# Patient Record
Sex: Female | Born: 1955
Health system: Southern US, Community
[De-identification: ages and names within clinical notes are randomized; demographics above are authoritative.]

## PROBLEM LIST (undated history)

## (undated) ENCOUNTER — Ambulatory Visit

## (undated) DIAGNOSIS — R011 Cardiac murmur, unspecified: Secondary | ICD-10-CM

## (undated) DIAGNOSIS — E119 Type 2 diabetes mellitus without complications: Secondary | ICD-10-CM

## (undated) DIAGNOSIS — K219 Gastro-esophageal reflux disease without esophagitis: Secondary | ICD-10-CM

## (undated) DIAGNOSIS — E785 Hyperlipidemia, unspecified: Secondary | ICD-10-CM

## (undated) DIAGNOSIS — F419 Anxiety disorder, unspecified: Secondary | ICD-10-CM

## (undated) DIAGNOSIS — I6529 Occlusion and stenosis of unspecified carotid artery: Secondary | ICD-10-CM

## (undated) DIAGNOSIS — I10 Essential (primary) hypertension: Secondary | ICD-10-CM

## (undated) HISTORY — DX: Occlusion and stenosis of unspecified carotid artery: I65.29

## (undated) HISTORY — DX: Type 2 diabetes mellitus without complications: E11.9

## (undated) HISTORY — DX: Essential (primary) hypertension: I10

## (undated) HISTORY — DX: Hyperlipidemia, unspecified: E78.5

## (undated) HISTORY — DX: Cardiac murmur, unspecified: R01.1

---

## 2011-08-24 ENCOUNTER — Other Ambulatory Visit (HOSPITAL_COMMUNITY): Payer: Self-pay | Admitting: Family Medicine

## 2011-08-24 DIAGNOSIS — I10 Essential (primary) hypertension: Secondary | ICD-10-CM

## 2011-08-24 DIAGNOSIS — R0989 Other specified symptoms and signs involving the circulatory and respiratory systems: Secondary | ICD-10-CM

## 2011-10-02 ENCOUNTER — Ambulatory Visit (HOSPITAL_COMMUNITY)
Admission: RE | Admit: 2011-10-02 | Discharge: 2011-10-02 | Disposition: A | Discharge status: Home / Self Care | Payer: BC Managed Care – PPO | Source: Ambulatory Visit | Attending: Family Medicine | Admitting: Family Medicine

## 2011-10-02 DIAGNOSIS — I6529 Occlusion and stenosis of unspecified carotid artery: Secondary | ICD-10-CM | POA: Insufficient documentation

## 2011-10-02 DIAGNOSIS — F17200 Nicotine dependence, unspecified, uncomplicated: Secondary | ICD-10-CM | POA: Insufficient documentation

## 2011-10-02 DIAGNOSIS — I10 Essential (primary) hypertension: Secondary | ICD-10-CM | POA: Insufficient documentation

## 2011-10-02 DIAGNOSIS — R0989 Other specified symptoms and signs involving the circulatory and respiratory systems: Secondary | ICD-10-CM | POA: Insufficient documentation

## 2011-11-01 HISTORY — PX: NM MYOCAR PERF EJECTION FRACTION: HXRAD630

## 2011-11-29 ENCOUNTER — Encounter: Payer: Self-pay | Admitting: Surgery

## 2011-11-30 ENCOUNTER — Encounter: Payer: Self-pay | Admitting: Surgery

## 2011-12-15 ENCOUNTER — Encounter: Payer: Self-pay | Admitting: Surgery

## 2011-12-18 ENCOUNTER — Encounter: Payer: Self-pay | Admitting: Surgery

## 2011-12-18 ENCOUNTER — Ambulatory Visit (INDEPENDENT_AMBULATORY_CARE_PROVIDER_SITE_OTHER): Payer: BC Managed Care – PPO | Admitting: Surgery

## 2011-12-18 ENCOUNTER — Other Ambulatory Visit: Payer: BC Managed Care – PPO

## 2011-12-18 VITALS — BP 128/64 | HR 87 | Resp 16 | Ht 64.0 in | Wt 171.0 lb

## 2011-12-18 DIAGNOSIS — I6529 Occlusion and stenosis of unspecified carotid artery: Secondary | ICD-10-CM | POA: Insufficient documentation

## 2011-12-18 DIAGNOSIS — Z01810 Encounter for preprocedural cardiovascular examination: Secondary | ICD-10-CM

## 2011-12-18 NOTE — Progress Notes (Signed)
[facility] [place]   Patient name: Rebecca Mathis MRN: [id number] DOB: 11-02-1955 Sex: female   Referred by: Dr. Allyson Sabal  Reason for referral:  Chief Complaint  Patient presents with  . Carotid    New pt, eval for rt CEA/Dr. Nanetta Batty    HISTORY OF PRESENT ILLNESS: The patient is referred today for evaluation of her right carotid stenosis. This was initially detected 3 months ago by auscultation of a carotid bruit. She underwent a duplex ultrasound which revealed a high-grade right carotid stenosis. The patient does have a significant family history for carotid disease. Her mother brother and sister all have carotid stenosis. The patient is a former smoker. She quit 3 months ago. She has a history of hypercholesterolemia which is treated with Crestor. She had a reaction to Lipitor. Her blood pressure is medically managed and is under good control.  From her carotid perspective, she is asymptomatic. Specifically, she denies numbness or weakness in either extremity. She denies slurred speech. She denies amaurosis fugax.  Past Medical History  Diagnosis Date  . Carotid artery occlusion   . Heart murmur   . Hypertension   . Dyslipidemia   . Hyperlipidemia   . CAD (coronary artery disease)     Past Surgical History  Procedure Date  . Cesarean section 1984    History   Social History  . Marital Status: Legally Separated    Spouse Name: N/A    Number of Children: N/A  . Years of Education: N/A   Occupational History  . Not on file.   Social History Main Topics  . Smoking status: Former Smoker    Quit date: 09/18/2011  . Smokeless tobacco: Never Used  . Alcohol Use: No  . Drug Use: No  . Sexually Active: Not on file   Other Topics Concern  . Not on file   Social History Narrative  . No narrative on file    Family History  Problem Relation Age of Onset  . Coronary artery disease Mother   . Cancer Mother     ovarian  . Diabetes  Mother   . Heart disease Mother   . Heart attack Mother   . Coronary artery disease Father   . Other Father     varicose veins  . Heart disease Sister   . Hypertension Sister   . Heart disease Brother   . Diabetes Brother     Allergies as of 12/18/2011 - Review Complete 12/18/2011  Allergen Reaction Noted  . Lipitor (atorvastatin) Nausea Only 11/30/2011  . Penicillins Nausea Only 11/30/2011    Current Outpatient Prescriptions on File Prior to Visit  Medication Sig Dispense Refill  . ALPRAZolam (XANAX) 0.25 MG tablet Take 0.25 mg by mouth at bedtime as needed.      Marland Kitchen aspirin 325 MG tablet Take 325 mg by mouth daily.      Marland Kitchen co-enzyme Q-10 30 MG capsule Take 200 mg by mouth 3 (three) times daily.       . fish oil-omega-3 fatty acids 1000 MG capsule Take 1,500 mg by mouth daily.       . hydrochlorothiazide (HYDRODIURIL) 25 MG tablet Take by mouth daily.      . NORVASC 10 MG tablet Take by mouth daily.      . rosuvastatin (CRESTOR) 5 MG tablet Take 5 mg by mouth daily.      . Red Yeast Rice 600 MG CAPS Take by mouth.  REVIEW OF SYSTEMS: Cardiovascular: No chest pain, chest pressure, palpitations, orthopnea, or dyspnea on exertion. No claudication or rest pain,  No history of DVT or phlebitis. Pulmonary: No productive cough, asthma or wheezing. Neurologic: No weakness, paresthesias, aphasia, or amaurosis. No dizziness. Hematologic: No bleeding problems or clotting disorders. Musculoskeletal: No joint pain or joint swelling. Gastrointestinal: No blood in stool or hematemesis Genitourinary: No dysuria or hematuria. Psychiatric:: No history of major depression. Integumentary: No rashes or ulcers. Constitutional: No fever or chills.  PHYSICAL EXAMINATION: General: The patient appears their stated age.  Vital signs are BP 128/64  Pulse 87  Resp 16  Ht 5\' 4"  (1.626 m)  Wt 171 lb (77.565 kg)  BMI 29.35 kg/m2  SpO2 99% HEENT:  No gross abnormalities Pulmonary:  Respirations are non-labored Abdomen: Soft and non-tender  Musculoskeletal: There are no major deformities.   Neurologic: No focal weakness or paresthesias are detected, Skin: There are no ulcer or rashes noted. Psychiatric: The patient has normal affect. Cardiovascular: There is a regular rate and rhythm without significant murmur appreciated. Date bilateral carotid bruits palpable left posterior tibial and right dorsalis pedis pulse  Diagnostic Studies: Outside imaging reveals high-grade right carotid stenosis with minimal left-sided stenosis. I had our lab check her bifurcation today. This is in the mid hyoid region  Outside Studies/Documentation Historical records were reviewed.  They showed asymptomatic right carotid stenosis  Medication Changes: None  Assessment:  Asymptomatic right carotid stenosis Plan: The patient has a high-grade, greater than 80% asymptomatic right carotid stenosis. We discussed our treatment options, it and we have decided to proceed with right carotid endarterectomy. We discussed the risks and benefits, including the risk of stroke and nerve injury, as well as the risk of cardiopulmonary complications. She has recently had a normal echo and negative Myoview. Her operation has been scheduled for Thursday, January 23.     Jorge Ny, M.D. [facility] [place] Office: [phone] Pager:  [phone]

## 2011-12-19 ENCOUNTER — Other Ambulatory Visit: Payer: Self-pay | Admitting: *Deleted

## 2012-01-10 HISTORY — PX: OTHER SURGICAL HISTORY: SHX169

## 2012-01-19 ENCOUNTER — Encounter (HOSPITAL_COMMUNITY): Payer: Self-pay

## 2012-01-19 ENCOUNTER — Encounter (HOSPITAL_COMMUNITY)
Admission: RE | Admit: 2012-01-19 | Discharge: 2012-01-19 | Disposition: A | Discharge status: Home / Self Care | Payer: BC Managed Care – PPO | Source: Ambulatory Visit | Attending: Surgery | Admitting: Surgery

## 2012-01-19 HISTORY — DX: Anxiety disorder, unspecified: F41.9

## 2012-01-19 HISTORY — DX: Gastro-esophageal reflux disease without esophagitis: K21.9

## 2012-01-19 LAB — URINALYSIS, ROUTINE W REFLEX MICROSCOPIC
Bilirubin Urine: NEGATIVE
Glucose, UA: NEGATIVE mg/dL
Hgb urine dipstick: NEGATIVE
Ketones, ur: NEGATIVE mg/dL
Leukocytes, UA: NEGATIVE
Protein, ur: NEGATIVE mg/dL
pH: 7 (ref 5.0–8.0)

## 2012-01-19 LAB — COMPREHENSIVE METABOLIC PANEL
ALT: 28 U/L (ref 0–35)
AST: 22 U/L (ref 0–37)
Albumin: 4.3 g/dL (ref 3.5–5.2)
Alkaline Phosphatase: 95 U/L (ref 39–117)
BUN: 9 mg/dL (ref 6–23)
Chloride: 98 mEq/L (ref 96–112)
Potassium: 3.8 mEq/L (ref 3.5–5.1)
Sodium: 140 mEq/L (ref 135–145)
Total Bilirubin: 0.5 mg/dL (ref 0.3–1.2)
Total Protein: 7.6 g/dL (ref 6.0–8.3)

## 2012-01-19 LAB — CBC
HCT: 41.9 % (ref 36.0–46.0)
MCHC: 34.4 g/dL (ref 30.0–36.0)
Platelets: 272 10*3/uL (ref 150–400)
RDW: 12 % (ref 11.5–15.5)
WBC: 11.1 10*3/uL — ABNORMAL HIGH (ref 4.0–10.5)

## 2012-01-19 LAB — PROTIME-INR: INR: 0.98 (ref 0.00–1.49)

## 2012-01-19 LAB — APTT: aPTT: 31 seconds (ref 24–37)

## 2012-01-19 NOTE — Progress Notes (Signed)
REQUESTED STRESS TEST, ECHO, EKG, OV FROM [facility] [phone].

## 2012-01-19 NOTE — Pre-Procedure Instructions (Signed)
LIAN POUNDS  01/19/2012   Your procedure is scheduled on: Thursday  02/01/12   Report to [facility] at 530 AM.  Call this number if you have problems the morning of surgery: [phone]   Remember:   Do not eat food or drink liquids after midnight.   Take these medicines the morning of surgery with A SIP OF WATER: XANAX, NORVASC   Do not wear jewelry, make-up or nail polish.  Do not wear lotions, powders, or perfumes. You may wear deodorant.  Do not shave 48 hours prior to surgery. Men may shave face and neck.  Do not bring valuables to the hospital.  Contacts, dentures or bridgework may not be worn into surgery.  Leave suitcase in the car. After surgery it may be brought to your room.  For patients admitted to the hospital, checkout time is 11:00 AM the day of  discharge.   Patients discharged the day of surgery will not be allowed to drive  home.  Name and phone number of your driver:   Special Instructions: Shower using CHG 2 nights before surgery and the night before surgery.  If you shower the day of surgery use CHG.  Use special wash - you have one bottle of CHG for all showers.  You should use approximately 1/3 of the bottle for each shower.   Please read over the following fact sheets that you were given: Pain Booklet, Coughing and Deep Breathing, Blood Transfusion Information, MRSA Information and Surgical Site Infection Prevention

## 2012-01-31 MED ORDER — SODIUM CHLORIDE 0.9 % IV SOLN: INTRAVENOUS | Status: DC

## 2012-01-31 MED ORDER — SODIUM CHLORIDE 0.9 % IV SOLN
1500.0000 mg | INTRAVENOUS | Status: DC
  Filled 2012-01-31: qty 1500

## 2012-02-01 ENCOUNTER — Encounter (HOSPITAL_COMMUNITY): Payer: Self-pay | Admitting: Anesthesiology

## 2012-02-01 ENCOUNTER — Encounter (HOSPITAL_COMMUNITY): Payer: Self-pay | Admitting: *Deleted

## 2012-02-01 ENCOUNTER — Encounter (HOSPITAL_COMMUNITY)
Admission: RE | Disposition: A | Discharge status: Home / Self Care | Payer: Self-pay | Source: Ambulatory Visit | Attending: Surgery

## 2012-02-01 ENCOUNTER — Inpatient Hospital Stay (HOSPITAL_COMMUNITY): Payer: BC Managed Care – PPO | Admitting: Anesthesiology

## 2012-02-01 ENCOUNTER — Inpatient Hospital Stay (HOSPITAL_COMMUNITY)
Admission: RE | Admit: 2012-02-01 | Discharge: 2012-02-02 | DRG: 839 | Disposition: A | Discharge status: Home / Self Care | Payer: BC Managed Care – PPO | Source: Ambulatory Visit | Attending: Surgery | Admitting: Surgery

## 2012-02-01 DIAGNOSIS — Z01810 Encounter for preprocedural cardiovascular examination: Secondary | ICD-10-CM

## 2012-02-01 DIAGNOSIS — Z7982 Long term (current) use of aspirin: Secondary | ICD-10-CM

## 2012-02-01 DIAGNOSIS — I6529 Occlusion and stenosis of unspecified carotid artery: Secondary | ICD-10-CM

## 2012-02-01 DIAGNOSIS — I10 Essential (primary) hypertension: Secondary | ICD-10-CM | POA: Diagnosis present

## 2012-02-01 DIAGNOSIS — Z79899 Other long term (current) drug therapy: Secondary | ICD-10-CM

## 2012-02-01 DIAGNOSIS — K219 Gastro-esophageal reflux disease without esophagitis: Secondary | ICD-10-CM | POA: Diagnosis present

## 2012-02-01 DIAGNOSIS — E785 Hyperlipidemia, unspecified: Secondary | ICD-10-CM | POA: Diagnosis present

## 2012-02-01 DIAGNOSIS — Z01818 Encounter for other preprocedural examination: Secondary | ICD-10-CM

## 2012-02-01 DIAGNOSIS — Z01812 Encounter for preprocedural laboratory examination: Secondary | ICD-10-CM

## 2012-02-01 DIAGNOSIS — I2510 Atherosclerotic heart disease of native coronary artery without angina pectoris: Secondary | ICD-10-CM | POA: Diagnosis present

## 2012-02-01 DIAGNOSIS — F411 Generalized anxiety disorder: Secondary | ICD-10-CM | POA: Diagnosis present

## 2012-02-01 DIAGNOSIS — Z87891 Personal history of nicotine dependence: Secondary | ICD-10-CM

## 2012-02-01 HISTORY — PX: CAROTID ENDARTERECTOMY: SUR193

## 2012-02-01 HISTORY — PX: ENDARTERECTOMY: SHX5162

## 2012-02-01 HISTORY — PX: PATCH ANGIOPLASTY: SHX6230

## 2012-02-01 LAB — TYPE AND SCREEN
ABO/RH(D): O POS
Antibody Screen: NEGATIVE

## 2012-02-01 SURGERY — ENDARTERECTOMY, CAROTID
Anesthesia: General | Site: Neck | Laterality: Right | Wound class: Clean

## 2012-02-01 MED ORDER — NEOSTIGMINE METHYLSULFATE 1 MG/ML IJ SOLN
INTRAMUSCULAR | Status: DC | PRN
  Administered 2012-02-01: 3 mg via INTRAVENOUS

## 2012-02-01 MED ORDER — PROTAMINE SULFATE 10 MG/ML IV SOLN
INTRAVENOUS | Status: DC | PRN
  Administered 2012-02-01: 50 mg via INTRAVENOUS

## 2012-02-01 MED ORDER — GUAIFENESIN-DM 100-10 MG/5ML PO SYRP: 15.0000 mL | ORAL_SOLUTION | ORAL | Status: DC | PRN

## 2012-02-01 MED ORDER — HYDRALAZINE HCL 20 MG/ML IJ SOLN: 10.0000 mg | INTRAMUSCULAR | Status: DC | PRN

## 2012-02-01 MED ORDER — PANTOPRAZOLE SODIUM 40 MG PO TBEC
40.0000 mg | DELAYED_RELEASE_TABLET | Freq: Every day | ORAL | Status: DC
  Administered 2012-02-02: 40 mg via ORAL
  Filled 2012-02-01 (×2): qty 1

## 2012-02-01 MED ORDER — HYDROCHLOROTHIAZIDE 25 MG PO TABS: 12.5000 mg | ORAL_TABLET | Freq: Every day | ORAL | Status: DC

## 2012-02-01 MED ORDER — POTASSIUM CHLORIDE CRYS ER 20 MEQ PO TBCR: 20.0000 meq | EXTENDED_RELEASE_TABLET | Freq: Once | ORAL | Status: AC | PRN

## 2012-02-01 MED ORDER — DOCUSATE SODIUM 100 MG PO CAPS
100.0000 mg | ORAL_CAPSULE | Freq: Every day | ORAL | Status: DC
  Administered 2012-02-02: 100 mg via ORAL
  Filled 2012-02-01: qty 1

## 2012-02-01 MED ORDER — PROMETHAZINE HCL 25 MG/ML IJ SOLN
INTRAMUSCULAR | Status: AC
  Filled 2012-02-01: qty 1

## 2012-02-01 MED ORDER — ALPRAZOLAM 0.25 MG PO TABS
0.2500 mg | ORAL_TABLET | Freq: Two times a day (BID) | ORAL | Status: DC | PRN
  Administered 2012-02-01: 0.25 mg via ORAL
  Filled 2012-02-01: qty 1

## 2012-02-01 MED ORDER — THROMBIN 20000 UNITS EX SOLR
CUTANEOUS | Status: AC
  Filled 2012-02-01: qty 20000

## 2012-02-01 MED ORDER — PHENYLEPHRINE HCL 10 MG/ML IJ SOLN
10.0000 mg | INTRAVENOUS | Status: DC | PRN
  Administered 2012-02-01: 25 ug/min via INTRAVENOUS

## 2012-02-01 MED ORDER — FENTANYL CITRATE 0.05 MG/ML IJ SOLN
INTRAMUSCULAR | Status: DC | PRN
  Administered 2012-02-01: 50 ug via INTRAVENOUS
  Administered 2012-02-01: 150 ug via INTRAVENOUS
  Administered 2012-02-01 (×3): 50 ug via INTRAVENOUS

## 2012-02-01 MED ORDER — ACETAMINOPHEN 325 MG PO TABS
325.0000 mg | ORAL_TABLET | ORAL | Status: DC | PRN
  Administered 2012-02-02: 650 mg via ORAL
  Filled 2012-02-01: qty 2

## 2012-02-01 MED ORDER — MORPHINE SULFATE 2 MG/ML IJ SOLN: 2.0000 mg | INTRAMUSCULAR | Status: DC | PRN

## 2012-02-01 MED ORDER — DOPAMINE-DEXTROSE 3.2-5 MG/ML-% IV SOLN
3.0000 ug/kg/min | INTRAVENOUS | Status: DC
  Filled 2012-02-01: qty 250

## 2012-02-01 MED ORDER — OXYCODONE HCL 5 MG PO TABS: 5.0000 mg | ORAL_TABLET | Freq: Once | ORAL | Status: DC | PRN

## 2012-02-01 MED ORDER — SODIUM CHLORIDE 0.9 % IV SOLN
500.0000 mL | Freq: Once | INTRAVENOUS | Status: AC | PRN
  Administered 2012-02-01: 500 mL via INTRAVENOUS

## 2012-02-01 MED ORDER — MIDAZOLAM HCL 2 MG/2ML IJ SOLN: 0.5000 mg | Freq: Once | INTRAMUSCULAR | Status: DC | PRN

## 2012-02-01 MED ORDER — ATORVASTATIN CALCIUM 10 MG PO TABS
10.0000 mg | ORAL_TABLET | Freq: Every day | ORAL | Status: DC
  Filled 2012-02-01 (×2): qty 1

## 2012-02-01 MED ORDER — PROPOFOL 10 MG/ML IV BOLUS
INTRAVENOUS | Status: DC | PRN
  Administered 2012-02-01: 160 mg via INTRAVENOUS

## 2012-02-01 MED ORDER — LIDOCAINE HCL (PF) 1 % IJ SOLN
INTRAMUSCULAR | Status: AC
  Filled 2012-02-01: qty 30

## 2012-02-01 MED ORDER — PHENOL 1.4 % MT LIQD: 1.0000 | OROMUCOSAL | Status: DC | PRN

## 2012-02-01 MED ORDER — HEPARIN SODIUM (PORCINE) 1000 UNIT/ML IJ SOLN
INTRAMUSCULAR | Status: DC | PRN
  Administered 2012-02-01: 7000 [IU] via INTRAVENOUS
  Administered 2012-02-01: 1000 [IU] via INTRAVENOUS

## 2012-02-01 MED ORDER — EPHEDRINE SULFATE 50 MG/ML IJ SOLN
INTRAMUSCULAR | Status: DC | PRN
  Administered 2012-02-01 (×3): 5 mg via INTRAVENOUS

## 2012-02-01 MED ORDER — ASPIRIN 81 MG PO CHEW: 162.0000 mg | CHEWABLE_TABLET | Freq: Every day | ORAL | Status: DC

## 2012-02-01 MED ORDER — HYDROMORPHONE HCL PF 1 MG/ML IJ SOLN
0.2500 mg | INTRAMUSCULAR | Status: DC | PRN
  Administered 2012-02-01 (×4): 0.5 mg via INTRAVENOUS

## 2012-02-01 MED ORDER — LABETALOL HCL 5 MG/ML IV SOLN
INTRAVENOUS | Status: DC | PRN
  Administered 2012-02-01: 10 mg via INTRAVENOUS

## 2012-02-01 MED ORDER — SODIUM CHLORIDE 0.9 % IR SOLN
Status: DC | PRN
  Administered 2012-02-01: 09:00:00

## 2012-02-01 MED ORDER — HYDROCHLOROTHIAZIDE 12.5 MG PO CAPS
12.5000 mg | ORAL_CAPSULE | Freq: Every day | ORAL | Status: DC
  Filled 2012-02-01 (×2): qty 1

## 2012-02-01 MED ORDER — LACTATED RINGERS IV SOLN
INTRAVENOUS | Status: DC | PRN
  Administered 2012-02-01 (×2): via INTRAVENOUS

## 2012-02-01 MED ORDER — CEFAZOLIN SODIUM-DEXTROSE 2-3 GM-% IV SOLR
INTRAVENOUS | Status: AC
  Administered 2012-02-01: 2 g via INTRAVENOUS
  Filled 2012-02-01: qty 50

## 2012-02-01 MED ORDER — GLYCOPYRROLATE 0.2 MG/ML IJ SOLN
INTRAMUSCULAR | Status: DC | PRN
  Administered 2012-02-01: 0.2 mg via INTRAVENOUS
  Administered 2012-02-01: 0.4 mg via INTRAVENOUS

## 2012-02-01 MED ORDER — ONDANSETRON HCL 4 MG/2ML IJ SOLN: 4.0000 mg | Freq: Four times a day (QID) | INTRAMUSCULAR | Status: DC | PRN

## 2012-02-01 MED ORDER — SODIUM CHLORIDE 0.9 % IV SOLN
INTRAVENOUS | Status: DC
  Administered 2012-02-01: 12:00:00 via INTRAVENOUS

## 2012-02-01 MED ORDER — HEMOSTATIC AGENTS (NO CHARGE) OPTIME
TOPICAL | Status: DC | PRN
  Administered 2012-02-01: 1 via TOPICAL

## 2012-02-01 MED ORDER — OXYCODONE HCL 5 MG/5ML PO SOLN: 5.0000 mg | Freq: Once | ORAL | Status: DC | PRN

## 2012-02-01 MED ORDER — VANCOMYCIN HCL IN DEXTROSE 1-5 GM/200ML-% IV SOLN
1000.0000 mg | Freq: Two times a day (BID) | INTRAVENOUS | Status: AC
  Administered 2012-02-01 – 2012-02-02 (×2): 1000 mg via INTRAVENOUS
  Filled 2012-02-01 (×3): qty 200

## 2012-02-01 MED ORDER — OXYCODONE-ACETAMINOPHEN 5-325 MG PO TABS
1.0000 | ORAL_TABLET | ORAL | Status: DC | PRN
  Administered 2012-02-01 (×2): 2 via ORAL
  Administered 2012-02-02: 1 via ORAL
  Filled 2012-02-01: qty 1
  Filled 2012-02-01: qty 2

## 2012-02-01 MED ORDER — MAGNESIUM SULFATE 40 MG/ML IJ SOLN
2.0000 g | Freq: Once | INTRAMUSCULAR | Status: AC | PRN
  Filled 2012-02-01: qty 50

## 2012-02-01 MED ORDER — HYDROMORPHONE HCL PF 1 MG/ML IJ SOLN
INTRAMUSCULAR | Status: AC
  Filled 2012-02-01: qty 1

## 2012-02-01 MED ORDER — ACETAMINOPHEN 650 MG RE SUPP: 325.0000 mg | RECTAL | Status: DC | PRN

## 2012-02-01 MED ORDER — ASPIRIN EC 325 MG PO TBEC
325.0000 mg | DELAYED_RELEASE_TABLET | Freq: Every day | ORAL | Status: DC
  Administered 2012-02-01 – 2012-02-02 (×2): 325 mg via ORAL
  Filled 2012-02-01 (×2): qty 1

## 2012-02-01 MED ORDER — PROMETHAZINE HCL 25 MG/ML IJ SOLN: 6.2500 mg | INTRAMUSCULAR | Status: DC | PRN

## 2012-02-01 MED ORDER — ROCURONIUM BROMIDE 100 MG/10ML IV SOLN
INTRAVENOUS | Status: DC | PRN
  Administered 2012-02-01: 50 mg via INTRAVENOUS

## 2012-02-01 MED ORDER — AMLODIPINE BESYLATE 2.5 MG PO TABS
2.5000 mg | ORAL_TABLET | Freq: Every day | ORAL | Status: DC
  Filled 2012-02-01 (×2): qty 1

## 2012-02-01 MED ORDER — 0.9 % SODIUM CHLORIDE (POUR BTL) OPTIME
TOPICAL | Status: DC | PRN
  Administered 2012-02-01: 3000 mL

## 2012-02-01 MED ORDER — METOPROLOL TARTRATE 1 MG/ML IV SOLN: 2.0000 mg | INTRAVENOUS | Status: DC | PRN

## 2012-02-01 MED ORDER — ALUM & MAG HYDROXIDE-SIMETH 200-200-20 MG/5ML PO SUSP: 15.0000 mL | ORAL | Status: DC | PRN

## 2012-02-01 MED ORDER — LABETALOL HCL 5 MG/ML IV SOLN: 10.0000 mg | INTRAVENOUS | Status: DC | PRN

## 2012-02-01 MED ORDER — MEPERIDINE HCL 25 MG/ML IJ SOLN: 6.2500 mg | INTRAMUSCULAR | Status: DC | PRN

## 2012-02-01 MED ORDER — POLYETHYLENE GLYCOL 3350 17 G PO PACK
17.0000 g | PACK | Freq: Every day | ORAL | Status: DC | PRN
  Filled 2012-02-01: qty 1

## 2012-02-01 MED ORDER — LIDOCAINE HCL (CARDIAC) 20 MG/ML IV SOLN
INTRAVENOUS | Status: DC | PRN
  Administered 2012-02-01: 70 mg via INTRAVENOUS

## 2012-02-01 MED ORDER — OXYCODONE-ACETAMINOPHEN 5-325 MG PO TABS
ORAL_TABLET | ORAL | Status: AC
  Filled 2012-02-01: qty 2

## 2012-02-01 MED ORDER — ONDANSETRON HCL 4 MG/2ML IJ SOLN
INTRAMUSCULAR | Status: DC | PRN
  Administered 2012-02-01: 4 mg via INTRAVENOUS

## 2012-02-01 MED ORDER — BISACODYL 10 MG RE SUPP: 10.0000 mg | Freq: Every day | RECTAL | Status: DC | PRN

## 2012-02-01 SURGICAL SUPPLY — 56 items
ADH SKN CLS APL DERMABOND .7 (GAUZE/BANDAGES/DRESSINGS) ×1
CANISTER SUCTION 2500CC (MISCELLANEOUS) ×2 IMPLANT
CATH ROBINSON RED A/P 18FR (CATHETERS) ×2 IMPLANT
CATH SUCT 10FR WHISTLE TIP (CATHETERS) ×2 IMPLANT
CLIP TI MEDIUM 24 (CLIP) ×2 IMPLANT
CLIP TI WIDE RED SMALL 24 (CLIP) ×2 IMPLANT
CLOTH BEACON ORANGE TIMEOUT ST (SAFETY) ×2 IMPLANT
COVER SURGICAL LIGHT HANDLE (MISCELLANEOUS) ×2 IMPLANT
CRADLE DONUT ADULT HEAD (MISCELLANEOUS) ×2 IMPLANT
DERMABOND ADVANCED (GAUZE/BANDAGES/DRESSINGS) ×1
DERMABOND ADVANCED .7 DNX12 (GAUZE/BANDAGES/DRESSINGS) ×1 IMPLANT
DRAIN CHANNEL 15F RND FF W/TCR (WOUND CARE) IMPLANT
DRAPE ORTHO SPLIT 77X108 STRL (DRAPES) ×2
DRAPE SURG ORHT 6 SPLT 77X108 (DRAPES) IMPLANT
DRAPE WARM FLUID 44X44 (DRAPE) ×2 IMPLANT
ELECT REM PT RETURN 9FT ADLT (ELECTROSURGICAL) ×2
ELECTRODE REM PT RTRN 9FT ADLT (ELECTROSURGICAL) ×1 IMPLANT
EVACUATOR SILICONE 100CC (DRAIN) IMPLANT
GLOVE BIOGEL PI IND STRL 6.5 (GLOVE) IMPLANT
GLOVE BIOGEL PI IND STRL 7.5 (GLOVE) ×1 IMPLANT
GLOVE BIOGEL PI INDICATOR 6.5 (GLOVE) ×2
GLOVE BIOGEL PI INDICATOR 7.5 (GLOVE) ×2
GLOVE SS BIOGEL STRL SZ 6.5 (GLOVE) IMPLANT
GLOVE SS BIOGEL STRL SZ 7 (GLOVE) IMPLANT
GLOVE SUPERSENSE BIOGEL SZ 6.5 (GLOVE) ×2
GLOVE SUPERSENSE BIOGEL SZ 7 (GLOVE) ×1
GLOVE SURG SS PI 7.0 STRL IVOR (GLOVE) ×2 IMPLANT
GLOVE SURG SS PI 7.5 STRL IVOR (GLOVE) ×2 IMPLANT
GOWN EXTRA PROTECTION XL (GOWNS) ×4 IMPLANT
GOWN PREVENTION PLUS XXLARGE (GOWN DISPOSABLE) ×2 IMPLANT
GOWN STRL NON-REIN LRG LVL3 (GOWN DISPOSABLE) ×3 IMPLANT
HEMOSTAT SNOW SURGICEL 2X4 (HEMOSTASIS) ×1 IMPLANT
HEMOSTAT SURGICEL 2X14 (HEMOSTASIS) IMPLANT
INSERT FOGARTY SM (MISCELLANEOUS) IMPLANT
KIT BASIN OR (CUSTOM PROCEDURE TRAY) ×2 IMPLANT
KIT ROOM TURNOVER OR (KITS) ×2 IMPLANT
NS IRRIG 1000ML POUR BTL (IV SOLUTION) ×5 IMPLANT
PACK CAROTID (CUSTOM PROCEDURE TRAY) ×2 IMPLANT
PAD ARMBOARD 7.5X6 YLW CONV (MISCELLANEOUS) ×4 IMPLANT
PATCH VASCULAR VASCU GUARD 1X6 (Vascular Products) ×1 IMPLANT
SHUNT CAROTID BYPASS 10 (VASCULAR PRODUCTS) IMPLANT
SHUNT CAROTID BYPASS 12FRX15.5 (VASCULAR PRODUCTS) IMPLANT
SPECIMEN JAR SMALL (MISCELLANEOUS) ×2 IMPLANT
SPONGE INTESTINAL PEANUT (DISPOSABLE) ×2 IMPLANT
SUT ETHILON 3 0 PS 1 (SUTURE) IMPLANT
SUT PROLENE 6 0 BV (SUTURE) ×6 IMPLANT
SUT PROLENE 7 0 BV 1 (SUTURE) IMPLANT
SUT PROLENE 7 0 BV1 MDA (SUTURE) ×1 IMPLANT
SUT SILK 3 0 TIES 17X18 (SUTURE)
SUT SILK 3-0 18XBRD TIE BLK (SUTURE) IMPLANT
SUT VIC AB 3-0 SH 27 (SUTURE) ×4
SUT VIC AB 3-0 SH 27X BRD (SUTURE) ×2 IMPLANT
SUT VICRYL 4-0 PS2 18IN ABS (SUTURE) ×2 IMPLANT
TOWEL OR 17X24 6PK STRL BLUE (TOWEL DISPOSABLE) ×3 IMPLANT
TOWEL OR 17X26 10 PK STRL BLUE (TOWEL DISPOSABLE) ×2 IMPLANT
WATER STERILE IRR 1000ML POUR (IV SOLUTION) ×2 IMPLANT

## 2012-02-01 NOTE — Anesthesia Preprocedure Evaluation (Addendum)
Anesthesia Evaluation  Patient identified by MRN, date of birth, ID band Patient awake    Reviewed: Allergy & Precautions, H&P , NPO status , Patient's Chart, lab work & pertinent test results  Airway Mallampati: II TM Distance: >3 FB Neck ROM: Full    Dental  (+) Teeth Intact and Dental Advisory Given   Pulmonary former smoker (quit 4 months),  breath sounds clear to auscultation  Pulmonary exam normal       Cardiovascular hypertension, Pt. on medications + Peripheral Vascular Disease + Valvular Problems/Murmurs MR Rhythm:Regular Rate:Normal  10/13 Echo   Ef - 55% mild MR Mild TR ,     Ekg- RBBB biatrial enlargment NSR    Neuro/Psych Anxiety    GI/Hepatic Neg liver ROS, GERD-  Controlled,  Endo/Other  negative endocrine ROS  Renal/GU negative Renal ROS     Musculoskeletal   Abdominal   Peds  Hematology   Anesthesia Other Findings   Reproductive/Obstetrics                          Anesthesia Physical Anesthesia Plan  ASA: III  Anesthesia Plan: General   Post-op Pain Management:    Induction: Intravenous  Airway Management Planned: Oral ETT  Additional Equipment: Arterial line  Intra-op Plan:   Post-operative Plan: Extubation in OR  Informed Consent: I have reviewed the patients History and Physical, chart, labs and discussed the procedure including the risks, benefits and alternatives for the proposed anesthesia with the patient or authorized representative who has indicated his/her understanding and acceptance.   Dental advisory given  Plan Discussed with: CRNA and Surgeon  Anesthesia Plan Comments: (Plan routine monitors, A line, GETA )        Anesthesia Quick Evaluation

## 2012-02-01 NOTE — Progress Notes (Signed)
Utilization Review Completed.   Getsemani Lindon, RN, BSN Nurse Case Manager  [phone]  

## 2012-02-01 NOTE — Transfer of Care (Signed)
Immediate Anesthesia Transfer of Care Note  Patient: Rebecca Mathis  Procedure(s) Performed: Procedure(s) (LRB) with comments: ENDARTERECTOMY CAROTID (Right) PATCH ANGIOPLASTY (Right) - Using 1 x 6 cm vascu- guard patch   Patient Location: PACU  Anesthesia Type:General  Level of Consciousness: awake, sedated and patient cooperative  Airway & Oxygen Therapy: Patient Spontanous Breathing and Patient connected to nasal cannula oxygen  Post-op Assessment: Report given to PACU RN, Post -op Vital signs reviewed and stable, Patient moving all extremities X 4 and Patient able to stick tongue midline  Post vital signs: Reviewed and stable  Complications: No apparent anesthesia complications

## 2012-02-01 NOTE — Anesthesia Postprocedure Evaluation (Signed)
  Anesthesia Post-op Note  Patient: Rebecca Mathis  Procedure(s) Performed: Procedure(s) (LRB) with comments: ENDARTERECTOMY CAROTID (Right) PATCH ANGIOPLASTY (Right) - Using 1 x 6 cm vascu- guard patch   Patient Location: PACU  Anesthesia Type:General  Level of Consciousness: awake, alert , oriented and patient cooperative  Airway and Oxygen Therapy: Patient Spontanous Breathing and Patient connected to nasal cannula oxygen  Post-op Pain: none  Post-op Assessment: Post-op Vital signs reviewed, Patient's Cardiovascular Status Stable, Respiratory Function Stable, Patent Airway, No signs of Nausea or vomiting and Pain level controlled  Post-op Vital Signs: Reviewed and stable  Complications: No apparent anesthesia complications

## 2012-02-01 NOTE — H&P (Signed)
[facility] [place]  Patient name: Rebecca Mathis MRN: [id number] DOB: January 27, 1955 Sex: female  Referred by: Dr. Allyson Sabal  Reason for referral:  Chief Complaint   Patient presents with   .  Carotid     New pt, eval for rt CEA/Dr. Nanetta Batty    HISTORY OF PRESENT ILLNESS:  The patient is referred today for evaluation of her right carotid stenosis. This was initially detected 3 months ago by auscultation of a carotid bruit. She underwent a duplex ultrasound which revealed a high-grade right carotid stenosis. The patient does have a significant family history for carotid disease. Her mother brother and sister all have carotid stenosis. The patient is a former smoker. She quit 3 months ago. She has a history of hypercholesterolemia which is treated with Crestor. She had a reaction to Lipitor. Her blood pressure is medically managed and is under good control.  From her carotid perspective, she is asymptomatic. Specifically, she denies numbness or weakness in either extremity. She denies slurred speech. She denies amaurosis fugax.  Past Medical History   Diagnosis  Date   .  Carotid artery occlusion    .  Heart murmur    .  Hypertension    .  Dyslipidemia    .  Hyperlipidemia    .  CAD (coronary artery disease)     Past Surgical History   Procedure  Date   .  Cesarean section  1984    History    Social History   .  Marital Status:  Legally Separated     Spouse Name:  N/A     Number of Children:  N/A   .  Years of Education:  N/A    Occupational History   .  Not on file.    Social History Main Topics   .  Smoking status:  Former Smoker     Quit date:  09/18/2011   .  Smokeless tobacco:  Never Used   .  Alcohol Use:  No   .  Drug Use:  No   .  Sexually Active:  Not on file    Other Topics  Concern   .  Not on file    Social History Narrative   .  No narrative on file    Family History   Problem  Relation  Age of Onset   .  Coronary artery  disease  Mother    .  Cancer  Mother       ovarian    .  Diabetes  Mother    .  Heart disease  Mother    .  Heart attack  Mother    .  Coronary artery disease  Father    .  Other  Father       varicose veins    .  Heart disease  Sister    .  Hypertension  Sister    .  Heart disease  Brother    .  Diabetes  Brother     Allergies as of 12/18/2011 - Review Complete 12/18/2011   Allergen  Reaction  Noted   .  Lipitor (atorvastatin)  Nausea Only  11/30/2011   .  Penicillins  Nausea Only  11/30/2011    Current Outpatient Prescriptions on File Prior to Visit   Medication  Sig  Dispense  Refill   .  ALPRAZolam (XANAX) 0.25 MG tablet  Take 0.25 mg by mouth at bedtime as needed.     Marland Kitchen  aspirin 325 MG tablet  Take 325 mg by mouth daily.     Marland Kitchen  co-enzyme Q-10 30 MG capsule  Take 200 mg by mouth 3 (three) times daily.     .  fish oil-omega-3 fatty acids 1000 MG capsule  Take 1,500 mg by mouth daily.     .  hydrochlorothiazide (HYDRODIURIL) 25 MG tablet  Take by mouth daily.     .  NORVASC 10 MG tablet  Take by mouth daily.     .  rosuvastatin (CRESTOR) 5 MG tablet  Take 5 mg by mouth daily.     .  Red Yeast Rice 600 MG CAPS  Take by mouth.      REVIEW OF SYSTEMS:  Cardiovascular: No chest pain, chest pressure, palpitations, orthopnea, or dyspnea on exertion. No claudication or rest pain, No history of DVT or phlebitis.  Pulmonary: No productive cough, asthma or wheezing.  Neurologic: No weakness, paresthesias, aphasia, or amaurosis. No dizziness.  Hematologic: No bleeding problems or clotting disorders.  Musculoskeletal: No joint pain or joint swelling.  Gastrointestinal: No blood in stool or hematemesis  Genitourinary: No dysuria or hematuria.  Psychiatric:: No history of major depression.  Integumentary: No rashes or ulcers.  Constitutional: No fever or chills.  PHYSICAL EXAMINATION:  General: The patient appears their stated age. Vital signs are BP 128/64  Pulse 87  Resp 16  Ht  5\' 4"  (1.626 m)  Wt 171 lb (77.565 kg)  BMI 29.35 kg/m2  SpO2 99%  HEENT: No gross abnormalities  Pulmonary: Respirations are non-labored  Abdomen: Soft and non-tender  Musculoskeletal: There are no major deformities.  Neurologic: No focal weakness or paresthesias are detected,  Skin: There are no ulcer or rashes noted.  Psychiatric: The patient has normal affect.  Cardiovascular: There is a regular rate and rhythm without significant murmur appreciated. Date bilateral carotid bruits palpable left posterior tibial and right dorsalis pedis pulse  Diagnostic Studies:  Outside imaging reveals high-grade right carotid stenosis with minimal left-sided stenosis. I had our lab check her bifurcation today. This is in the mid hyoid region  Outside Studies/Documentation  Historical records were reviewed. They showed asymptomatic right carotid stenosis  Medication Changes:  None  Assessment:  Asymptomatic right carotid stenosis  Plan:  The patient has a high-grade, greater than 80% asymptomatic right carotid stenosis. We discussed our treatment options, it and we have decided to proceed with right carotid endarterectomy. We discussed the risks and benefits, including the risk of stroke and nerve injury, as well as the risk of cardiopulmonary complications. She has recently had a normal echo and negative Myoview. Her operation has been scheduled for Thursday, January 23.   NO new sx's since office visit   V. Charlena Cross, M.D.  [facility] [place]  Office: [phone]  Pager: [phone]

## 2012-02-01 NOTE — Op Note (Signed)
[facility] [place]  Patient name: Rebecca Mathis MRN: [id number] DOB: [birth year] Sex: female  02/01/2012 Pre-operative Diagnosis: Asymptomatic right carotid stenosis Post-operative diagnosis:  Same Surgeon:  Jorge Ny Assistants:  Narda Amber Procedure:    right carotid Endarterectomy with bovine pericardial  patch angioplasty Anesthesia:  General Blood Loss:  See anesthesia record Specimens:  Carotid Plaque to pathology  Findings:  85 %stenosis; Thrombus:  none  Indications:  57 yo with Asx Right catotid stenosis detected by u/s comes in today for operative repair.  Procedure:  The patient was identified in the holding area and taken to [facility] OR ROOM 11  The patient was then placed supine on the table.   General endotrachial anesthesia was administered.  The patient was prepped and draped in the usual sterile fashion.  A time out was called and antibiotics were administered.  The incision was made along the anterior border of the right sternocleidomastoid muscle.  Cautery was used to dissect through the subcutaneous tissue.  The platysma muscle was divided with cautery.  The internal jugular vein was exposed along its anterior medial border.  The common facial vein was exposed and then divided between 2-0 silk ties and metal clips.  The common carotid artery was then circumferentially exposed and encircled with an umbilical tape.  The vagus nerve was identified and protected.  Next sharp dissection was used to expose the external carotid artery and the superior thyroid artery.  The were encircled with a blue vessel loop and a 2-0 silk tie respectively.  Finally, the internal carotid was carefully dissected free.  An umbilical tape was placed around the internal carotid artery distal to the diseased segment.  The hypoglossal nerve was visualized throughout and protected.  The patient was given systemic heparinization.  A bovine carotid patch was selected and prepared  on the back table.  A 10 french shunt was also prepared.  After blood pressure readings were appropriate and the heparin had been given time to circulate, the internal carotid artery was occluded with a baby Gregory clamp.  The external and common carotid arteries were then occluded with vascular clamps and the 2-0 tie tightened on the superior thyroid artery.  A #11 blade was used to make an arteriotomy in the common carotid artery.  This was extended with Potts scissors along the anterior and lateral border of the common and internal carotid artery.  Approximately 85% stenosis was identified.  There was no thrombus identified.  The 10 french shunt was not placed as there was excellent back bleeding.  A kleiner kuntz elevator was used to perform endarterectomy.  An eversion endarterectomy was performed in the external carotid artery.  A good distal endpoint was obtained in the internal carotid artery.  The specimen was removed and sent to pathology.  Heparinized saline was used to irrigate the endarterectomized field.  All potential embolic debris was removed.  Bovine pericardial patch angioplasty was then performed using a running 6-0 Prolene.  The common internal and external carotid arteries were all appropriately flushed. The artery was again irrigated with heparin saline.  The anastomosis was then secured. The clamp was first released on the external carotid artery followed by the common carotid artery approximately 30 seconds later, bloodflow was reestablish through the internal carotid artery.  Next, a hand-held  Doppler was used to evaluate the signals in the common, external, and internal  carotid arteries, all of which had appropriate signals. I then administered  50  mg protamine. The wound was then irrigated.  After hemostasis was achieved, the carotid sheath was reapproximated with 3-0 Vicryl. The  platysma muscle was reapproximated with running 3-0 Vicryl. The skin  was closed with 4-0 Vicryl.  Dermabond was placed on the skin. The  patient was then successfully extubated. Her neurologic exam was  similar to his preprocedural exam. The patient was then taken to recovery room  in stable condition. There were no complications.     Disposition:  To PACU in stable condition.  Relevant Operative Details:  Web like area of stenosis at the internal carotid origin creating approximately 85% stenosis.  No shunt was used, as there was excellent back bleeding from the internal carotid.  The hypoglossal was in the usual location.  It was visualized  And protected  V. Durene Cal, M.D. [facility] [place] Office: [phone] Pager:  [phone]

## 2012-02-02 ENCOUNTER — Encounter (HOSPITAL_COMMUNITY): Payer: Self-pay | Admitting: Surgery

## 2012-02-02 LAB — BASIC METABOLIC PANEL
BUN: 6 mg/dL (ref 6–23)
Chloride: 103 mEq/L (ref 96–112)
GFR calc Af Amer: >90 mL/min (ref >90)
Potassium: 3.7 mEq/L (ref 3.5–5.1)
Sodium: 138 mEq/L (ref 135–145)

## 2012-02-02 LAB — CBC
HCT: 35 % — ABNORMAL LOW (ref 36.0–46.0)
Hemoglobin: 11.8 g/dL — ABNORMAL LOW (ref 12.0–15.0)
RDW: 12.2 % (ref 11.5–15.5)
WBC: 9.5 10*3/uL (ref 4.0–10.5)

## 2012-02-02 MED ORDER — OXYCODONE-ACETAMINOPHEN 5-325 MG PO TABS: 1.0000 | ORAL_TABLET | ORAL | Status: DC | PRN

## 2012-02-02 NOTE — Discharge Summary (Signed)
[facility] Discharge Summary  Rebecca Mathis October 19, 1955 57 y.o. female  [id number]  Admission Date: 02/01/2012  Discharge Date: 02/02/12  Physician: Rebecca Libman, MD  Admission Diagnosis: RIGHT ICA STENOSIS   HPI:   This is a 57 y.o. female is referred today for evaluation of her right carotid stenosis. This was initially detected 3 months ago by auscultation of a carotid bruit. She underwent a duplex ultrasound which revealed a high-grade right carotid stenosis. The patient does have a significant family history for carotid disease. Her mother brother and sister all have carotid stenosis. The patient is a former smoker. She quit 3 months ago. She has a history of hypercholesterolemia which is treated with Crestor. She had a reaction to Lipitor. Her blood pressure is medically managed and is under good control.  From her carotid perspective, she is asymptomatic. Specifically, she denies numbness or weakness in either extremity. She denies slurred speech. She denies amaurosis fuga   Hospital Course:  The patient was admitted to the hospital and taken to the operating room on 02/01/2012 and underwent right carotid endarterectomy.  The pt tolerated the procedure well and was transported to the PACU in good condition.   By POD 1, the pt neuro status Pt is doing well and discharged home.  The remainder of the hospital course consisted of increasing mobilization and increasing intake of solids without difficulty.    Basename 02/02/12 0458  NA 138  K 3.7  CL 103  CO2 26  GLUCOSE 121*  BUN 6  CALCIUM 8.0*    Basename 02/02/12 0458  WBC 9.5  HGB 11.8*  HCT 35.0*  PLT 211   No results found for this basename: INR:2 in the last 72 hours   Discharge Instructions:   The patient is discharged to home with extensive instructions on wound care and progressive ambulation.  They are instructed not to drive or perform any heavy lifting until returning to see the  physician in his office.  Discharge Orders    Future Orders Please Complete By Expires   Resume previous diet      Driving Restrictions      Comments:   No driving for 24 hourss   Lifting restrictions      Comments:   No lifting for 6 weeks   Call MD for:  temperature >100.5      Call MD for:  redness, tenderness, or signs of infection (pain, swelling, bleeding, redness, odor or green/yellow discharge around incision site)      Call MD for:  severe or increased pain, loss or decreased feeling  in affected limb(s)         Discharge Diagnosis:  RIGHT ICA STENOSIS  Secondary Diagnosis: Patient Active Problem List  Diagnosis  . Occlusion and stenosis of carotid artery without mention of cerebral infarction   Past Medical History  Diagnosis Date  . Carotid artery occlusion   . Heart murmur   . Hypertension   . Dyslipidemia   . Hyperlipidemia   . Anxiety   . GERD (gastroesophageal reflux disease)     INDIGESTION W/ SOME FOODS       Rebecca Mathis  Home Medication Instructions [id number]   Printed on:02/02/12 0949  Medication Information                    NORVASC 10 MG tablet Take by mouth daily.           hydrochlorothiazide (HYDRODIURIL) 25 MG  tablet Take 12.5 mg by mouth daily.            aspirin 325 MG tablet Take 0.5 mg by mouth daily. Patient states she takes half in the morning and half at night.           rosuvastatin (CRESTOR) 5 MG tablet Take 5 mg by mouth daily.           ALPRAZolam (XANAX) 0.25 MG tablet Take 0.25 mg by mouth at bedtime as needed. For anxiety           co-enzyme Q-10 30 MG capsule Take 200 mg by mouth daily.            oxyCODONE-acetaminophen (PERCOCET/ROXICET) 5-325 MG per tablet Take 1-2 tablets by mouth every 4 (four) hours as needed.             Disposition: home  Patient's condition: is Good  Follow up: 1. Dr.  Myra Mathis in 2 weeks.   Rebecca Massed, PA-C [facility] [phone] 02/02/2012    9:49 AM

## 2012-02-02 NOTE — Progress Notes (Addendum)
Pt voiding, ambulating and eating without difficulty.  Discharge instructions given to pt and daughter.  Both verbalized understanding with all questions answered.  Pt discharged to home with daughter.  Roselie Awkward, RN

## 2012-02-02 NOTE — Progress Notes (Signed)
VASCULAR AND VEIN SURGERY POST - OP [facility] PROGRESS NOTE  Date of Surgery: 02/01/2012  Surgeon(s): Nada Libman, MD 1 Day Post-Op right Carotid Endarterectomy .  HPI: Rebecca Mathis is a 57 y.o. female who is 1 Day Post-Op right Carotid Endarterectomy . Patient is doing well. Pre-operative symptoms are Improved Patient reports headache; Patient denies difficulty swallowing; denies weakness in upper or lower extremities; Pt. denies other symptoms of stroke or TIA.  IMAGING: No results found.  Significant Diagnostic Studies: CBC Lab Results  Component Value Date   WBC 9.5 02/02/2012   HGB 11.8* 02/02/2012   HCT 35.0* 02/02/2012   MCV 93.8 02/02/2012   PLT 211 02/02/2012    BMET    Component Value Date/Time   NA 138 02/02/2012 0458   K 3.7 02/02/2012 0458   CL 103 02/02/2012 0458   CO2 26 02/02/2012 0458   GLUCOSE 121* 02/02/2012 0458   BUN 6 02/02/2012 0458   CREATININE 0.48* 02/02/2012 0458   CALCIUM 8.0* 02/02/2012 0458   GFRNONAA >90 02/02/2012 0458   GFRAA >90 02/02/2012 0458    COAG Lab Results  Component Value Date   INR 0.98 01/19/2012   No results found for this basename: PTT      Intake/Output Summary (Last 24 hours) at 02/02/12 0744 Last data filed at 02/01/12 1600  Gross per 24 hour  Intake   1700 ml  Output    900 ml  Net    800 ml    Physical Exam:  BP Readings from Last 3 Encounters:  02/02/12 103/48  02/02/12 103/48  01/19/12 150/65   Temp Readings from Last 3 Encounters:  02/02/12 98.9 F (37.2 C) Oral  02/02/12 98.9 F (37.2 C) Oral  01/19/12 98.6 F (37 C)    SpO2 Readings from Last 3 Encounters:  02/02/12 88%  02/02/12 88%  01/19/12 99%   Pulse Readings from Last 3 Encounters:  02/02/12 89  02/02/12 89  01/19/12 80    Pt is A&O x 3 Gait is normal Speech is fluent right Neck Wound is healing well Patient with Negative tongue deviation and Negative facial droop Pt has good and equal strength in all  extremities  Assessment/Plan:: Rebecca Mathis is a 57 y.o. female is S/P Right Carotid endarterectomy Pt is voiding, ambulating and taking po well    Discharge to: Home Follow-up in 2 weeks BP medications were helled low BP 103/48 this am.  Mosetta Pigeon [phone] 02/02/2012 7:44 AM

## 2012-02-04 NOTE — Discharge Summary (Signed)
I agree with the above. The patient is neurologically intact following carotid endarterectomy She'll followup in 2 weeks  Wells Aamira Bischoff

## 2012-02-16 ENCOUNTER — Encounter: Payer: Self-pay | Admitting: Surgery

## 2012-02-19 ENCOUNTER — Other Ambulatory Visit: Payer: Self-pay | Admitting: *Deleted

## 2012-02-19 ENCOUNTER — Encounter: Payer: Self-pay | Admitting: Surgery

## 2012-02-19 ENCOUNTER — Ambulatory Visit (INDEPENDENT_AMBULATORY_CARE_PROVIDER_SITE_OTHER): Payer: BC Managed Care – PPO | Admitting: Surgery

## 2012-02-19 VITALS — BP 124/80 | HR 84 | Ht 66.0 in | Wt 171.0 lb

## 2012-02-19 DIAGNOSIS — I6529 Occlusion and stenosis of unspecified carotid artery: Secondary | ICD-10-CM

## 2012-02-19 DIAGNOSIS — Z48812 Encounter for surgical aftercare following surgery on the circulatory system: Secondary | ICD-10-CM

## 2012-02-19 NOTE — Progress Notes (Signed)
In today for followup. She is status post right carotid endarterectomy with patch angioplasty on 02/01/2012. This was done for asymptomatic right carotid stenosis. Intraoperative findings included an 85% stenosis. Her postoperative course was uncomplicated. She is back today for followup. She has no complaints.  On examination, her incision is healing nicely. She is neurologically intact.  The patient is done very well following her operation. She requested that she get her carotid follow up  done in my office. I will have her back in 6 months

## 2012-02-24 ENCOUNTER — Other Ambulatory Visit: Payer: Self-pay

## 2012-06-21 ENCOUNTER — Other Ambulatory Visit: Payer: Self-pay | Admitting: *Deleted

## 2012-06-21 MED ORDER — ROSUVASTATIN CALCIUM 5 MG PO TABS: 5.0000 mg | ORAL_TABLET | Freq: Every day | ORAL | Status: DC

## 2012-08-16 ENCOUNTER — Encounter: Payer: Self-pay | Admitting: Surgery

## 2012-08-19 ENCOUNTER — Encounter: Payer: Self-pay | Admitting: Surgery

## 2012-08-19 ENCOUNTER — Other Ambulatory Visit (INDEPENDENT_AMBULATORY_CARE_PROVIDER_SITE_OTHER): Payer: BC Managed Care – PPO | Admitting: *Deleted

## 2012-08-19 ENCOUNTER — Ambulatory Visit (INDEPENDENT_AMBULATORY_CARE_PROVIDER_SITE_OTHER): Payer: BC Managed Care – PPO | Admitting: Surgery

## 2012-08-19 DIAGNOSIS — Z48812 Encounter for surgical aftercare following surgery on the circulatory system: Secondary | ICD-10-CM

## 2012-08-19 DIAGNOSIS — I6529 Occlusion and stenosis of unspecified carotid artery: Secondary | ICD-10-CM

## 2012-08-19 NOTE — Progress Notes (Signed)
[facility] [place]   Patient name: Rebecca Mathis MRN: [id number] DOB: Jan 27, 1955 Sex: female     Chief Complaint  Patient presents with  . Carotid    6 mo  F/up with vascular lab study    HISTORY OF PRESENT ILLNESS: The patient is back today for followup.She is status post right carotid endarterectomy with patch angioplasty on 02/01/2012. This was done for asymptomatic right carotid stenosis. Intraoperative findings included an 85% stenosis. Her postoperative course was uncomplicated. She has no complaints. She has not smoked a cigarette and over a year. She is eating better and exercising regularly. She has no neurologic deficits. She continues to take her statin.   Past Medical History  Diagnosis Date  . Carotid artery occlusion   . Heart murmur   . Hypertension   . Dyslipidemia   . Hyperlipidemia   . Anxiety   . GERD (gastroesophageal reflux disease)     INDIGESTION W/ SOME FOODS      Past Surgical History  Procedure Laterality Date  . Cesarean section  1984  . Endarterectomy  02/01/2012    Procedure: ENDARTERECTOMY CAROTID;  Surgeon: Nada Libman, MD;  Location: [facility] OR;  Service: Vascular;  Laterality: Right;  . Patch angioplasty  02/01/2012    Procedure: PATCH ANGIOPLASTY;  Surgeon: Nada Libman, MD;  Location: [facility] OR;  Service: Vascular;  Laterality: Right;  Using 1 x 6 cm vascu- guard patch   . Carotid endarterectomy Right 02-01-12    cea    History   Social History  . Marital Status: Single    Spouse Name: N/A    Number of Children: N/A  . Years of Education: N/A   Occupational History  . Not on file.   Social History Main Topics  . Smoking status: Former Smoker    Quit date: 09/18/2011  . Smokeless tobacco: Never Used  . Alcohol Use: No  . Drug Use: No  . Sexually Active: Not on file   Other Topics Concern  . Not on file   Social History Narrative  . No narrative on file    Family History  Problem Relation Age of  Onset  . Coronary artery disease Mother   . Cancer Mother     ovarian  . Diabetes Mother   . Heart disease Mother   . Heart attack Mother   . Coronary artery disease Father   . Other Father     varicose veins  . Heart disease Sister   . Hypertension Sister   . Heart disease Brother   . Diabetes Brother     Allergies as of 08/19/2012 - Review Complete 08/19/2012  Allergen Reaction Noted  . Lipitor (atorvastatin) Nausea Only 11/30/2011  . Penicillins Nausea Only 11/30/2011  . Lidocaine Other (See Comments) 02/01/2012  . Restylane-l (hyaluronic acid-lidocaine)  01/19/2012    Current Outpatient Prescriptions on File Prior to Visit  Medication Sig Dispense Refill  . aspirin 325 MG tablet Take 0.5 mg by mouth daily. Patient states she takes half in the morning and half at night.      . co-enzyme Q-10 30 MG capsule Take 300 mg by mouth daily.       . hydrochlorothiazide (HYDRODIURIL) 25 MG tablet Take 25 mg by mouth daily.       . NORVASC 10 MG tablet Take by mouth daily.      . rosuvastatin (CRESTOR) 5 MG tablet Take 1 tablet (5 mg total) by mouth daily.  30 tablet  11  . ALPRAZolam (XANAX) 0.25 MG tablet Take 0.25 mg by mouth at bedtime as needed. For anxiety      . oxyCODONE-acetaminophen (PERCOCET/ROXICET) 5-325 MG per tablet Take 1-2 tablets by mouth every 4 (four) hours as needed.  30 tablet  0   No current facility-administered medications on file prior to visit.     REVIEW OF SYSTEMS: Cardiovascular: No chest pain, chest pressure, palpitations, orthopnea, or dyspnea on exertion. No claudication or rest pain,  No history of DVT or phlebitis. Pulmonary: No productive cough, asthma or wheezing. Neurologic: No weakness, paresthesias, aphasia, or amaurosis. No dizziness. Hematologic: No bleeding problems or clotting disorders. Musculoskeletal: No joint pain or joint swelling. Gastrointestinal: No blood in stool or hematemesis Genitourinary: No dysuria or  hematuria. Psychiatric:: No history of major depression. Integumentary: No rashes or ulcers. Constitutional: No fever or chills.  PHYSICAL EXAMINATION:   Vital signs are BP 127/77  Pulse 74  Resp 16  Ht 5\' 6"  (1.676 m)  Wt 184 lb (83.462 kg)  BMI 29.71 kg/m2  SpO2 97% General: The patient appears their stated age. HEENT:  No gross abnormalities Pulmonary:  Non labored breathing Musculoskeletal: There are no major deformities. Neurologic: No focal weakness or paresthesias are detected, Skin: There are no ulcer or rashes noted. Psychiatric: The patient has normal affect. Cardiovascular: There is a regular rate and rhythm without significant murmur appreciated. No carotid bruits   Diagnostic Studies Ultrasound was ordered and reviewed today. Less than 40% left carotid stenosis, widely patent carotid endarterectomy site on the right with evidence of mild hyperplasia at the proximal patch.  Assessment: Status post right carotid endarterectomy Plan: The patient is doing fairly well at this time. She will be transitioned to surveillance ultrasound on a yearly basis. She is scheduled to see [place] back in one year  V. Charlena Cross, M.D. [facility] [place] Office: [phone] Pager:  [phone]

## 2012-08-19 NOTE — Addendum Note (Signed)
Addended by: Adria Dill L on: 08/19/2012 11:53 AM   Modules accepted: Orders

## 2012-11-14 ENCOUNTER — Other Ambulatory Visit: Payer: Self-pay

## 2013-02-27 ENCOUNTER — Other Ambulatory Visit: Payer: Self-pay | Admitting: Surgery

## 2013-02-27 DIAGNOSIS — I6529 Occlusion and stenosis of unspecified carotid artery: Secondary | ICD-10-CM

## 2013-02-27 DIAGNOSIS — Z48812 Encounter for surgical aftercare following surgery on the circulatory system: Secondary | ICD-10-CM

## 2013-08-25 ENCOUNTER — Ambulatory Visit: Payer: BC Managed Care – PPO | Admitting: Family

## 2013-08-25 ENCOUNTER — Other Ambulatory Visit (HOSPITAL_COMMUNITY): Payer: BC Managed Care – PPO

## 2013-10-03 ENCOUNTER — Encounter: Payer: Self-pay | Admitting: Family

## 2013-10-06 ENCOUNTER — Ambulatory Visit (INDEPENDENT_AMBULATORY_CARE_PROVIDER_SITE_OTHER): Payer: BC Managed Care – PPO | Admitting: Family

## 2013-10-06 ENCOUNTER — Encounter: Payer: Self-pay | Admitting: Family

## 2013-10-06 ENCOUNTER — Ambulatory Visit (HOSPITAL_COMMUNITY)
Admission: RE | Admit: 2013-10-06 | Discharge: 2013-10-06 | Disposition: A | Discharge status: Home / Self Care | Payer: BC Managed Care – PPO | Source: Ambulatory Visit | Attending: Family | Admitting: Family

## 2013-10-06 ENCOUNTER — Ambulatory Visit: Payer: BC Managed Care – PPO | Admitting: Family

## 2013-10-06 ENCOUNTER — Other Ambulatory Visit (HOSPITAL_COMMUNITY): Payer: BC Managed Care – PPO

## 2013-10-06 VITALS — BP 146/80 | HR 76 | Resp 16 | Ht 66.0 in | Wt 180.3 lb

## 2013-10-06 DIAGNOSIS — Z48812 Encounter for surgical aftercare following surgery on the circulatory system: Secondary | ICD-10-CM | POA: Insufficient documentation

## 2013-10-06 DIAGNOSIS — I6529 Occlusion and stenosis of unspecified carotid artery: Secondary | ICD-10-CM | POA: Diagnosis not present

## 2013-10-06 NOTE — Progress Notes (Signed)
Established Carotid Patient   History of Present Illness  Rebecca Mathis is a 58 y.o. female patient of Dr. Trula Slade who is status post right carotid endarterectomy with patch angioplasty on 02/01/2012. This was done for asymptomatic right carotid stenosis. Intraoperative findings included an 85% stenosis. She returns today for follow up. She states that she has white coat syndrome and her blood pressure goes up; states otherwise her blood pressure runs about 114/70. Pt denies any history of stroke or TIA, specifically denies hemiparesis, denies monocular loss of vision, denies unilateral facial drooping, denies aphasia. She exercises 3 days/week.  Pt denies New Medical or Surgical History.  Pt Diabetic: No Pt smoker: former smoker, quit in 2013  Pt meds include: Statin : Yes ASA: Yes Other anticoagulants/antiplatelets: no   Past Medical History  Diagnosis Date  . Carotid artery occlusion   . Heart murmur   . Hypertension   . Dyslipidemia   . Hyperlipidemia   . Anxiety   . GERD (gastroesophageal reflux disease)     INDIGESTION W/ SOME FOODS      Social History History  Substance Use Topics  . Smoking status: Former Smoker    Quit date: 09/18/2011  . Smokeless tobacco: Never Used  . Alcohol Use: No    Family History Family History  Problem Relation Age of Onset  . Coronary artery disease Mother   . Cancer Mother     ovarian  . Diabetes Mother   . Heart disease Mother   . Heart attack Mother   . Coronary artery disease Father   . Other Father     varicose veins  . Heart disease Sister   . Hypertension Sister   . Heart disease Brother   . Diabetes Brother     Surgical History Past Surgical History  Procedure Laterality Date  . Cesarean section  1984  . Endarterectomy  02/01/2012    Procedure: ENDARTERECTOMY CAROTID;  Surgeon: Serafina Mitchell, MD;  Location: [place];  Service: Vascular;  Laterality: Right;  . Patch angioplasty  02/01/2012    Procedure:  PATCH ANGIOPLASTY;  Surgeon: Serafina Mitchell, MD;  Location: [facility] OR;  Service: Vascular;  Laterality: Right;  Using 1 x 6 cm vascu- guard patch   . Carotid endarterectomy Right 02-01-12    cea    Allergies  Allergen Reactions  . Lipitor [Atorvastatin] Nausea Only  . Penicillins Nausea Only  . Lidocaine Other (See Comments)    Temporary vision loss  . Restylane-L [Hyaluronic Acid-Lidocaine]     TEMPORARY VISION  LOSS     Current Outpatient Prescriptions  Medication Sig Dispense Refill  . ALPRAZolam (XANAX) 0.25 MG tablet Take 0.25 mg by mouth at bedtime as needed. For anxiety      . aspirin 325 MG tablet Take 0.5 mg by mouth daily. Patient states she takes half in the morning and half at night.      Marland Kitchen b complex vitamins tablet Take 1 tablet by mouth daily.      . Biotin 5000 MCG CAPS Take by mouth.      . Cholecalciferol (D3 ADULT PO) Take 2,000 mcg by mouth daily.      Marland Kitchen co-enzyme Q-10 30 MG capsule Take 300 mg by mouth daily.       . fish oil-omega-3 fatty acids 1000 MG capsule Take 1 g by mouth daily.      . hydrochlorothiazide (HYDRODIURIL) 25 MG tablet Take 25 mg by mouth daily.       Marland Kitchen  NORVASC 10 MG tablet Take by mouth daily.      . rosuvastatin (CRESTOR) 5 MG tablet Take 1 tablet (5 mg total) by mouth daily.  30 tablet  11  . CINNAMON PO Take by mouth.      . oxyCODONE-acetaminophen (PERCOCET/ROXICET) 5-325 MG per tablet Take 1-2 tablets by mouth every 4 (four) hours as needed.  30 tablet  0  . Potassium Chloride (KCL-20 PO) Take 50 mg by mouth.       No current facility-administered medications for this visit.    Review of Systems : See HPI for pertinent positives and negatives.  Physical Examination  Filed Vitals:   10/06/13 1306 10/06/13 1320  BP: 170/84 146/80  Pulse: 78 76  Resp: 16   Height: 5\' 6"  (1.676 m)   Weight: 180 lb 4.8 oz (81.784 kg)    Body mass index is 29.12 kg/(m^2).  General: WDWN female in NAD GAIT: normal Eyes: PERRLA Pulmonary:   Non-labored, CTAB, Negative  Rales, Negative rhonchi, & Negative wheezing.  Cardiac: regular Rhythm ,  Negative detected murmur.  VASCULAR EXAM Carotid Bruits Right Left   Negative Negative  . Radial pulses are 2+ palpable and equal.                                                                                                                            LE Pulses Right Left       POPLITEAL  not palpable   not palpable       POSTERIOR TIBIAL   palpable    palpable        DORSALIS PEDIS      ANTERIOR TIBIAL  palpable   palpable     Gastrointestinal: soft, nontender, BS WNL, no r/g,  negative masses palpated.  Musculoskeletal: Negative muscle atrophy/wasting. M/S 5/5 throughout, Extremities without ischemic changes.  Neurologic: A&O X 3; Appropriate Affect ; SENSATION ;normal;  Speech is normal CN 2-12 intact, Pain and light touch intact in extremities, Motor exam as listed above.   Non-Invasive Vascular Imaging CAROTID DUPLEX 10/06/2013   CEREBROVASCULAR DUPLEX EVALUATION    INDICATION: Carotid artery disease    PREVIOUS INTERVENTION(S): Right carotid endarterectomy 02/01/2012    DUPLEX EXAM: Carotid duplex    RIGHT  LEFT  Peak Systolic Velocities (cm/s) End Diastolic Velocities (cm/s) Plaque LOCATION Peak Systolic Velocities (cm/s) End Diastolic Velocities (cm/s) Plaque  76 14 - CCA PROXIMAL 126 13 -  86 21  - CCA MID 79 22 -  90 23 HT CCA DISTAL 85 27 -  99 17 - ECA 110 17 -  82 15 HT ICA PROXIMAL 89 31 HT  77 22 - ICA MID 65 21 -  75 24 - ICA DISTAL 86 28 -    N/A ICA / CCA Ratio (PSV) 1.08  Antegrade Vertebral Flow Antegrade,atypical  893 Brachial Systolic Pressure (mmHg) 810  Triphasic Brachial Artery Waveforms Triphasic    Plaque Morphology:  HM =  Homogeneous, HT = Heterogeneous, CP = Calcific Plaque, SP = Smooth Plaque, IP = Irregular Plaque     ADDITIONAL FINDINGS:     IMPRESSION: 1. Patent right carotid endarterectomy site with no evidence for  restenosis. 2. Less than 40% left internal carotid artery stenosis. 3. Abnormal, antegrade left vertebral artery waveform.    Compared to the previous exam:  No significant change     Assessment: Rebecca Mathis is a 58 y.o. female who presents with asymptomatic patent right carotid endarterectomy site with no evidence for restenosis and minimal left ICA stenosis. The  ICA stenosis is  Unchanged from previous exam.  Plan: Follow-up in 1 year with Carotid Duplex scan.   I discussed in depth with the patient the nature of atherosclerosis, and emphasized the importance of maximal medical management including strict control of blood pressure, blood glucose, and lipid levels, obtaining regular exercise, and continued cessation of smoking.  The patient is aware that without maximal medical management the underlying atherosclerotic disease process will progress, limiting the benefit of any interventions. The patient was given information about stroke prevention and what symptoms should prompt the patient to seek immediate medical care. Thank you for allowing [place] to participate in this patient's care.  Clemon Chambers, RN, MSN, FNP-C [facility] [place] Office: [facility] Physician: Trula Slade  10/06/2013 1:42 PM

## 2013-10-06 NOTE — Patient Instructions (Signed)
Stroke Prevention Some medical conditions and behaviors are associated with an increased chance of having a stroke. You may prevent a stroke by making healthy choices and managing medical conditions. HOW CAN I REDUCE MY RISK OF HAVING A STROKE?   Stay physically active. Get at least 30 minutes of activity on most or all days.  Do not smoke. It may also be helpful to avoid exposure to secondhand smoke.  Limit alcohol use. Moderate alcohol use is considered to be:  No more than 2 drinks per day for men.  No more than 1 drink per day for nonpregnant women.  Eat healthy foods. This involves:  Eating 5 or more servings of fruits and vegetables a day.  Making dietary changes that address high blood pressure (hypertension), high cholesterol, diabetes, or obesity.  Manage your cholesterol levels.  Making food choices that are high in fiber and low in saturated fat, trans fat, and cholesterol may control cholesterol levels.  Take any prescribed medicines to control cholesterol as directed by your health care provider.  Manage your diabetes.  Controlling your carbohydrate and sugar intake is recommended to manage diabetes.  Take any prescribed medicines to control diabetes as directed by your health care provider.  Control your hypertension.  Making food choices that are low in salt (sodium), saturated fat, trans fat, and cholesterol is recommended to manage hypertension.  Take any prescribed medicines to control hypertension as directed by your health care provider.  Maintain a healthy weight.  Reducing calorie intake and making food choices that are low in sodium, saturated fat, trans fat, and cholesterol are recommended to manage weight.  Stop drug abuse.  Avoid taking birth control pills.  Talk to your health care provider about the risks of taking birth control pills if you are over 35 years old, smoke, get migraines, or have ever had a blood clot.  Get evaluated for sleep  disorders (sleep apnea).  Talk to your health care provider about getting a sleep evaluation if you snore a lot or have excessive sleepiness.  Take medicines only as directed by your health care provider.  For some people, aspirin or blood thinners (anticoagulants) are helpful in reducing the risk of forming abnormal blood clots that can lead to stroke. If you have the irregular heart rhythm of atrial fibrillation, you should be on a blood thinner unless there is a good reason you cannot take them.  Understand all your medicine instructions.  Make sure that other conditions (such as anemia or atherosclerosis) are addressed. SEEK IMMEDIATE MEDICAL CARE IF:   You have sudden weakness or numbness of the face, arm, or leg, especially on one side of the body.  Your face or eyelid droops to one side.  You have sudden confusion.  You have trouble speaking (aphasia) or understanding.  You have sudden trouble seeing in one or both eyes.  You have sudden trouble walking.  You have dizziness.  You have a loss of balance or coordination.  You have a sudden, severe headache with no known cause.  You have new chest pain or an irregular heartbeat. Any of these symptoms may represent a serious problem that is an emergency. Do not wait to see if the symptoms will go away. Get medical help at once. Call your local emergency services (911 in [place] Do not drive yourself to the hospital. Document Released: 02/03/2004 Document Revised: 05/12/2013 Document Reviewed: 06/28/2012 [employer] Patient Information 2015 [employer]. This information is not intended to replace advice given   to you by your health care provider. Make sure you discuss any questions you have with your health care provider.  

## 2013-10-07 NOTE — Addendum Note (Signed)
Addended by: Mena Goes on: 10/07/2013 04:50 PM   Modules accepted: Orders

## 2013-10-24 ENCOUNTER — Other Ambulatory Visit: Payer: Self-pay

## 2013-12-09 ENCOUNTER — Encounter: Payer: Self-pay | Admitting: Neurology

## 2013-12-09 ENCOUNTER — Ambulatory Visit (INDEPENDENT_AMBULATORY_CARE_PROVIDER_SITE_OTHER): Payer: BC Managed Care – PPO | Admitting: Neurology

## 2013-12-09 VITALS — BP 143/76 | HR 75 | Temp 98.8°F | Ht 65.0 in | Wt 183.0 lb

## 2013-12-09 DIAGNOSIS — E663 Overweight: Secondary | ICD-10-CM

## 2013-12-09 DIAGNOSIS — G4733 Obstructive sleep apnea (adult) (pediatric): Secondary | ICD-10-CM

## 2013-12-09 DIAGNOSIS — R351 Nocturia: Secondary | ICD-10-CM

## 2013-12-09 NOTE — Patient Instructions (Signed)

## 2013-12-09 NOTE — Progress Notes (Signed)
Subjective:    Patient ID: Rebecca Mathis is a 58 y.o. female.  HPI     Star Age, MD, PhD [facility] [address], [address]O. [place], [place] [address]  Dear Aldona Bar,  I saw your patient, Rebecca Mathis, upon your kind request in my neurologic clinic today for initial consultation of her sleep disorder, in particular, concern for underlying obstructive sleep apnea. The patient is unaccompanied today. As you know, Rebecca Mathis is a 58 year old right-handed woman with an underlying medical history of hypertension, hyperlipidemia, anxiety, reflux disease, carotid artery disease, status post right carotid endarterectomy with patch angioplasty on 02/01/2012, who reports snoring, excessive daytime somnolence and witnessed apneas. Her family has noted the apneic spells and her sister has been diagnosed with OSA and RLS recently.  She works as a Medical illustrator. She quit smoking some 2 years ago, she drinks one coffee per day, no sodas, rare alcohol. She denies restless leg symptoms and is not aware of any leg twitching or kicking in her sleep. She has woken herself up with a gasping sensation and feels that sometimes she wakes up with her throat closing.   Her typical bedtime is reported to be around 10 PM to MN and usual wake time is around 6 to 7 AM. Sleep onset typically occurs within minutes. She reports feeling adequately rested upon awakening. She wakes up on an average 1 times in the middle of the night and has to go to the bathroom 1 times on a typical night. She denies morning headaches. Her ESS is 2/24. She does not watch TV in bed. She lives alone. She has no pets. 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  Her Past Medical History Is Significant For: Past Medical History  Diagnosis Date  . Carotid artery occlusion   . Heart murmur   . Hypertension   . Dyslipidemia   . Hyperlipidemia   . Anxiety   . GERD (gastroesophageal reflux  disease)     INDIGESTION W/ SOME FOODS      Her Past Surgical History Is Significant For: Past Surgical History  Procedure Laterality Date  . Cesarean section  1984  . Endarterectomy  02/01/2012    Procedure: ENDARTERECTOMY CAROTID;  Surgeon: Serafina Mitchell, MD;  Location: [place];  Service: Vascular;  Laterality: Right;  . Patch angioplasty  02/01/2012    Procedure: PATCH ANGIOPLASTY;  Surgeon: Serafina Mitchell, MD;  Location: [facility] OR;  Service: Vascular;  Laterality: Right;  Using 1 x 6 cm vascu- guard patch   . Carotid endarterectomy Right 02-01-12    cea    Her Family History Is Significant For: Family History  Problem Relation Age of Onset  . Coronary artery disease Mother   . Cancer Mother     ovarian  . Diabetes Mother   . Heart disease Mother   . Heart attack Mother   . Coronary artery disease Father   . Other Father     varicose veins  . Heart disease Sister   . Hypertension Sister   . Heart disease Brother   . Diabetes Brother     Her Social History Is Significant For: History   Social History  . Marital Status: Single    Spouse Name: N/A    Number of Children: 1  . Years of Education: masters   Social History Main Topics  . Smoking status: Former Smoker    Quit date: 09/18/2011  . Smokeless tobacco: Never  Used  . Alcohol Use: No  . Drug Use: No  . Sexual Activity: None   Other Topics Concern  . None   Social History Narrative    Her Allergies Are:  Allergies  Allergen Reactions  . Lipitor [Atorvastatin] Nausea Only  . Penicillins Nausea Only  . Lidocaine Other (See Comments)    Temporary vision loss  . Restylane-L [Hyaluronic Acid-Lidocaine]     TEMPORARY VISION  LOSS   :   Her Current Medications Are:  Outpatient Encounter Prescriptions as of 12/09/2013  Medication Sig  . aspirin 325 MG tablet Take 0.5 mg by mouth daily. Patient states she takes half at night.  Marland Kitchen b complex vitamins tablet Take 1 tablet by mouth daily.  . Biotin 5000 MCG  CAPS Take by mouth.  . Cholecalciferol (D3 ADULT PO) Take 2,000 mcg by mouth daily.  Marland Kitchen CINNAMON PO Take by mouth.  . fish oil-omega-3 fatty acids 1000 MG capsule Take 1 g by mouth daily.  . hydrochlorothiazide (HYDRODIURIL) 25 MG tablet Take 25 mg by mouth daily. 1/2 tablet daily  . NORVASC 10 MG tablet Take by mouth daily.  . Nutritional Supplements (JUICE PLUS FIBRE PO) Take by mouth 2 (two) times daily. Takes 6 tablets  daily  . rosuvastatin (CRESTOR) 5 MG tablet Take 1 tablet (5 mg total) by mouth daily.  . [DISCONTINUED] ALPRAZolam (XANAX) 0.25 MG tablet Take 0.25 mg by mouth at bedtime as needed. For anxiety  . [DISCONTINUED] co-enzyme Q-10 30 MG capsule Take 300 mg by mouth daily.   . [DISCONTINUED] oxyCODONE-acetaminophen (PERCOCET/ROXICET) 5-325 MG per tablet Take 1-2 tablets by mouth every 4 (four) hours as needed. (Patient not taking: Reported on 12/09/2013)  . [DISCONTINUED] Potassium Chloride (KCL-20 PO) Take 50 mg by mouth.  :  Review of Systems:  Out of a complete 14 point review of systems, all are reviewed and negative with the exception of these symptoms as listed below:   Review of Systems  Neurological:       Snoring    Objective:  Neurologic Exam  Physical Exam Physical Examination:   Filed Vitals:   12/09/13 1341  BP: 143/76  Pulse: 75  Temp: 98.8 F (37.1 C)    General Examination: The patient is a very pleasant 58 y.o. female in no acute distress. She appears well-developed and well-nourished and very well groomed.   HEENT: Normocephalic, atraumatic, pupils are equal, round and reactive to light and accommodation. Funduscopic exam is normal with sharp disc margins noted. Extraocular tracking is good without limitation to gaze excursion or nystagmus noted. Normal smooth pursuit is noted. Hearing is grossly intact. Tympanic membranes are clear bilaterally. Face is symmetric with normal facial animation and normal facial sensation. Speech is clear with no  dysarthria noted. There is no hypophonia. There is no lip, neck/head, jaw or voice tremor. Neck is supple with full range of passive and active motion. There are no carotid bruits on auscultation. Oropharynx exam reveals: mild mouth dryness, adequate dental hygiene and moderate airway crowding, due to narrow airway entry, tonsils in place and redundant soft palate. I did not see the tip of her uvula. Mallampati is class III. Tongue protrudes centrally and palate elevates symmetrically. Neck size is 15.5 inches. She has a Mild overbite. Nasal inspection reveals no significant nasal mucosal bogginess or redness and no septal deviation.   Chest: Clear to auscultation without wheezing, rhonchi or crackles noted.  Heart: S1+S2+0, regular and normal without murmurs, rubs or gallops noted.  Abdomen: Soft, non-tender and non-distended with normal bowel sounds appreciated on auscultation.  Extremities: There is no pitting edema in the distal lower extremities bilaterally. Pedal pulses are intact.  Skin: Warm and dry without trophic changes noted. There are no varicose veins.  Musculoskeletal: exam reveals no obvious joint deformities, tenderness or joint swelling or erythema.   Neurologically:  Mental status: The patient is awake, alert and oriented in all 4 spheres. Her immediate and remote memory, attention, language skills and fund of knowledge are appropriate. There is no evidence of aphasia, agnosia, apraxia or anomia. Speech is clear with normal prosody and enunciation. Thought process is linear. Mood is normal and affect is normal.  Cranial nerves II - XII are as described above under HEENT exam. In addition: shoulder shrug is normal with equal shoulder height noted. Motor exam: Normal bulk, strength and tone is noted. There is no drift, tremor or rebound. Romberg is negative. Reflexes are 2+ throughout. Babinski: Toes are flexor bilaterally. Fine motor skills and coordination: intact with normal  finger taps, normal hand movements, normal rapid alternating patting, normal foot taps and normal foot agility.  Cerebellar testing: No dysmetria or intention tremor on finger to nose testing. Heel to shin is unremarkable bilaterally. There is no truncal or gait ataxia.  Sensory exam: intact to light touch, pinprick, vibration, temperature sense in the upper and lower extremities.  Gait, station and balance: She stands easily. No veering to one side is noted. No leaning to one side is noted. Posture is age-appropriate and stance is narrow based. Gait shows normal stride length and normal pace. No problems turning are noted. She turns en bloc. Tandem walk is unremarkable. Intact toe and heel stance is noted.               Assessment and Plan:   In summary, STEPHENIA VOGAN is a very pleasant 60 y.o.-year old female with an underlying medical history of hypertension, hyperlipidemia, anxiety, reflux disease, carotid artery disease, status post right carotid endarterectomy with patch angioplasty on 02/01/2012, who reports snoring, excessive daytime somnolence and witnessed apneas, as well as a family history of OSA. Her history and physical exam are concerning for underlying obstructive sleep apnea (OSA). I had a long chat with the patient about my findings and the diagnosis of OSA, its prognosis and treatment options. We talked about medical treatments, surgical interventions and non-pharmacological approaches. I explained in particular the risks and ramifications of untreated moderate to severe OSA, especially with respect to developing cardiovascular disease down the Road, including congestive heart failure, difficult to treat hypertension, cardiac arrhythmias, or stroke. Even type 2 diabetes has, in part, been linked to untreated OSA. Symptoms of untreated OSA include daytime sleepiness, memory problems, mood irritability and mood disorder such as depression and anxiety, lack of energy, as well as recurrent  headaches, especially morning headaches. We talked about trying to maintain a healthy lifestyle in general, as well as the importance of weight control. I encouraged the patient to eat healthy, exercise daily and keep well hydrated, to keep a scheduled bedtime and wake time routine, to not skip any meals and eat healthy snacks in between meals. I advised the patient not to drive when feeling sleepy. I recommended the following at this time: sleep study with potential positive airway pressure titration. (We will score hypopneas at 3% and split the sleep study into diagnostic and treatment portion, if the estimated. 2 hour AHI is >15/h).   I explained the sleep test  procedure to the patient and also outlined possible surgical and non-surgical treatment options of OSA, including the use of a custom-made dental device (which would require a referral to a specialist dentist or oral surgeon), upper airway surgical options, such as pillar implants, radiofrequency surgery, tongue base surgery, and UPPP (which would involve a referral to an ENT surgeon). Rarely, jaw surgery such as mandibular advancement may be considered.  I also explained the CPAP treatment option to the patient, who indicated that she would be willing to try CPAP if the need arises. I explained the importance of being compliant with PAP treatment, not only for insurance purposes but primarily to improve Her symptoms, and for the patient's long term health benefit, including to reduce Her cardiovascular risks. I answered all her questions today and the patient was in agreement. I would like to see her back after the sleep study is completed and encouraged her to call with any interim questions, concerns, problems or updates.   Thank you very much for allowing me to participate in the care of this nice patient. If I can be of any further assistance to you please do not hesitate to call me at [phone].  Sincerely,   Star Age, MD, PhD

## 2013-12-10 ENCOUNTER — Ambulatory Visit (INDEPENDENT_AMBULATORY_CARE_PROVIDER_SITE_OTHER): Payer: BC Managed Care – PPO | Admitting: Neurology

## 2013-12-10 VITALS — BP 124/84 | HR 67 | Ht 65.0 in | Wt 183.0 lb

## 2013-12-10 DIAGNOSIS — G4733 Obstructive sleep apnea (adult) (pediatric): Secondary | ICD-10-CM

## 2013-12-11 NOTE — Sleep Study (Signed)
Please see the scanned sleep study interpretation located in the Procedure tab within the Chart Review Section 

## 2013-12-19 ENCOUNTER — Telehealth: Payer: Self-pay | Admitting: Neurology

## 2013-12-19 ENCOUNTER — Encounter: Payer: Self-pay | Admitting: Neurology

## 2013-12-19 DIAGNOSIS — G4733 Obstructive sleep apnea (adult) (pediatric): Secondary | ICD-10-CM

## 2013-12-19 NOTE — Telephone Encounter (Signed)
Please call and notify the patient that the recent sleep study did confirm the diagnosis of obstructive sleep apnea and that I recommend treatment for this in the form of CPAP. This will require a repeat sleep study for proper titration and mask fitting. Please explain to patient and arrange for a CPAP titration study. I have placed an order in the chart. Thanks, Joseandres Mazer, MD, PhD [facility] ([facility])  

## 2013-12-22 NOTE — Telephone Encounter (Signed)
Patient returned call to the office to review the results from her sleep study.  She is aware of the finding of obstructive sleep apnea and Dr. Guadelupe Sabin recommendation for treatment in the form of CPAP.  She understands that this will require a repeat study which she has scheduled for 01/28/2014.  A copy of the sleep study will be sent to the referring provider and the patient has requested her report to be mailed to the home address.

## 2013-12-23 ENCOUNTER — Ambulatory Visit (INDEPENDENT_AMBULATORY_CARE_PROVIDER_SITE_OTHER): Payer: BC Managed Care – PPO | Admitting: Neurology

## 2013-12-23 DIAGNOSIS — G4733 Obstructive sleep apnea (adult) (pediatric): Secondary | ICD-10-CM

## 2013-12-23 NOTE — Sleep Study (Signed)
Please see the scanned sleep study interpretation located in the Procedure tab within the Chart Review section. 

## 2013-12-24 ENCOUNTER — Telehealth: Payer: Self-pay | Admitting: Neurology

## 2013-12-24 ENCOUNTER — Encounter: Payer: Self-pay | Admitting: Neurology

## 2013-12-24 DIAGNOSIS — G4733 Obstructive sleep apnea (adult) (pediatric): Secondary | ICD-10-CM

## 2013-12-24 NOTE — Telephone Encounter (Signed)
Please call and inform patient that I have entered an order for treatment with PAP. She did well during the latest sleep study with BiPAP. We will, therefore, arrange for a machine for home use through a DME (durable medical equipment) company of Her choice; and I will see the patient back in follow-up in about 6 weeks. Please also explain to the patient that I will be looking out for compliance data downloaded from the machine, which can be done remotely through a modem at times or stored on an SD card in the back of the machine. At the time of the followup appointment we will discuss sleep study results and how it is going with PAP treatment at home. Please advise patient to bring Her machine at the time of the visit; at least for the first visit, even though this is cumbersome. Bringing the machine for every visit after that may not be needed, but often helps for the first visit. Please also make sure, the patient has a follow-up appointment with me in about 6 weeks from the setup date, thanks.   Star Age, MD, PhD [facility])

## 2013-12-26 ENCOUNTER — Encounter: Payer: Self-pay | Admitting: Neurology

## 2013-12-26 NOTE — Telephone Encounter (Signed)
Called the patient and reviewed the results from her recent sleep study.  She is aware that she did well during her sleep study with BiPAP.  Therefore, Dr. Rexene Alberts has recommended the patient to begin Bipap therapy.  The patient has requested her order to be processed by [employer].  Her referring provider has received a copy of her test results and the patient will receive her report via mail.

## 2014-01-16 IMAGING — US US CAROTID DUPLEX BILAT
1 series · 13 of 24 positions shown · non-contrast
Comparison: None

CLINICAL DATA: Carotid bruit, history hypertension, smoking

BILATERAL CAROTID DUPLEX ULTRASOUND
TECHNIQUE: Gray scale imaging, color Doppler and duplex ultrasound
was performed of bilateral carotid and vertebral arteries in the
neck.

[Series 1: us carotid duplex bilat · 0.05mm/px · 13 of 95 slices shown]
[im 1/95]
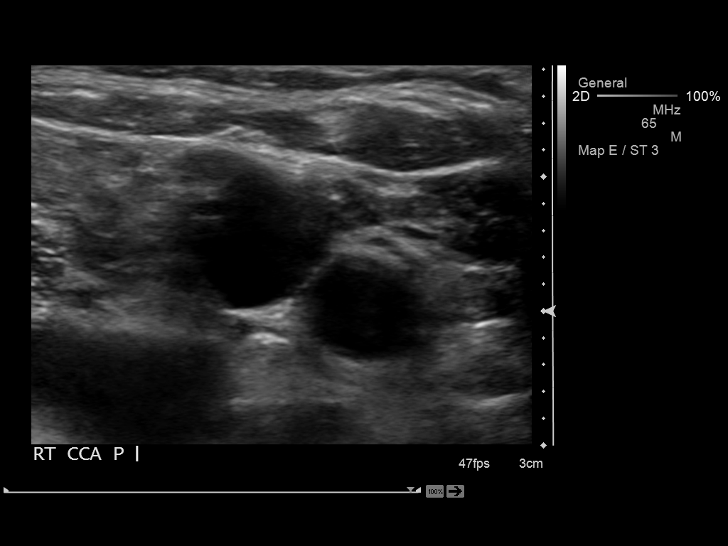
[im 9/95]
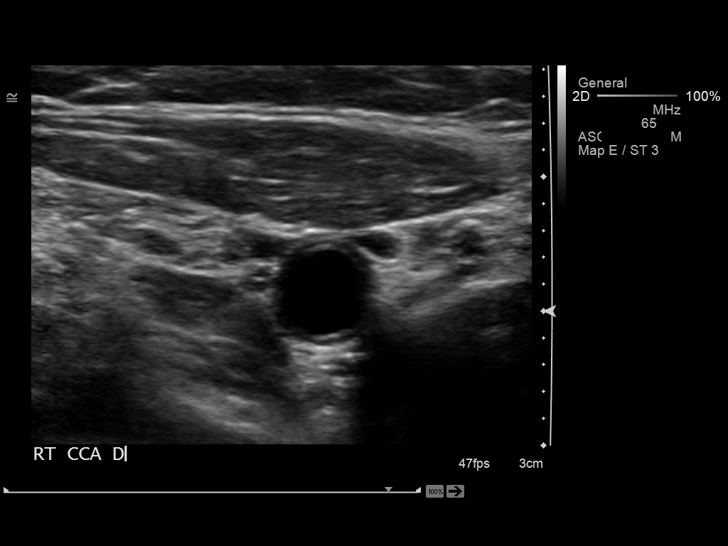
[im 17/95]
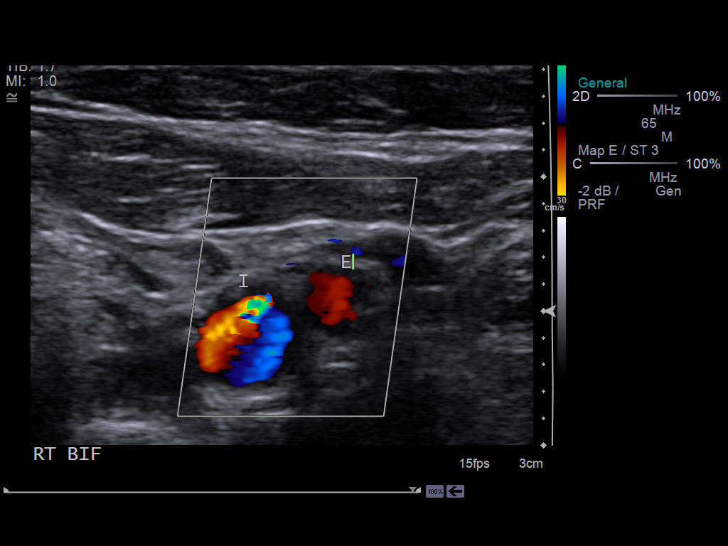
[im 25/95]
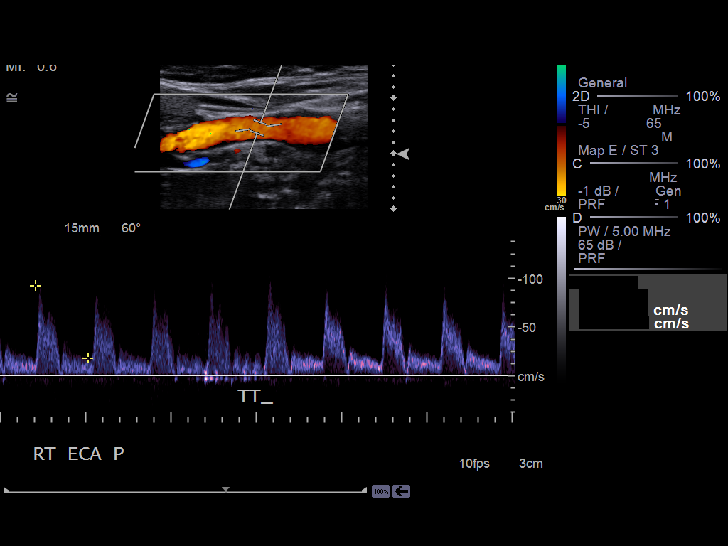
[im 33/95]
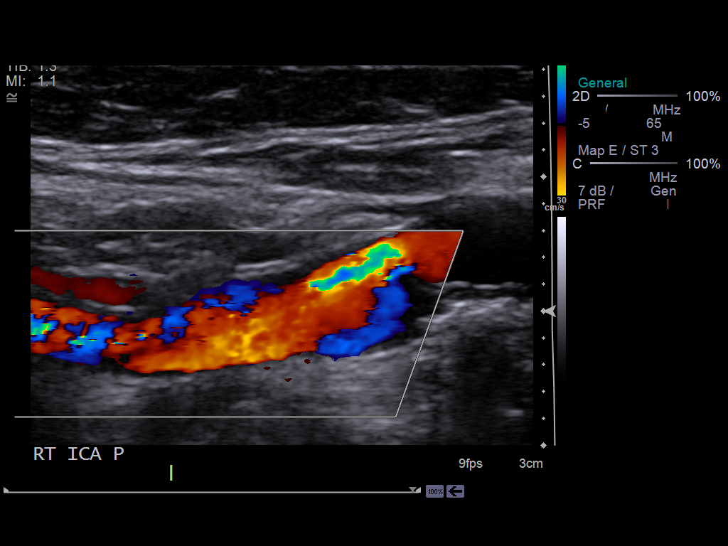
[im 41/95]
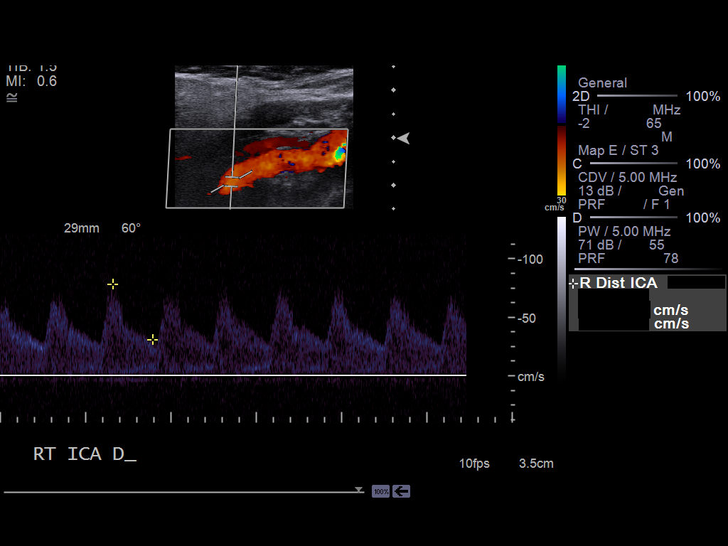
[im 50/95]
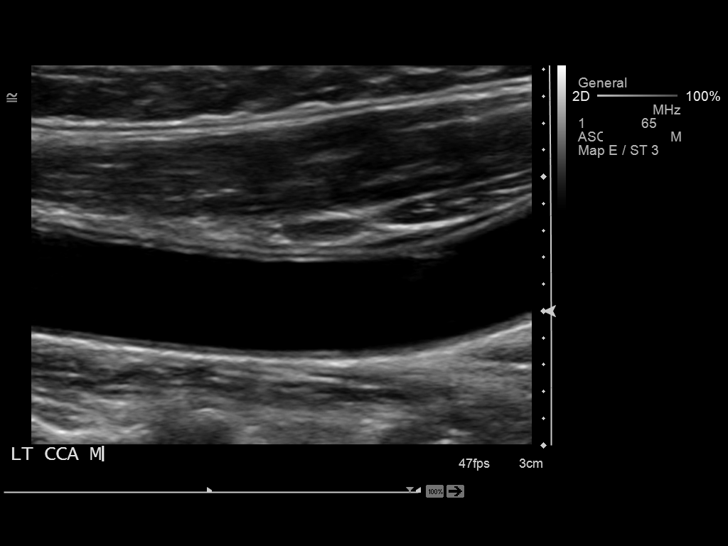
[im 54/95]
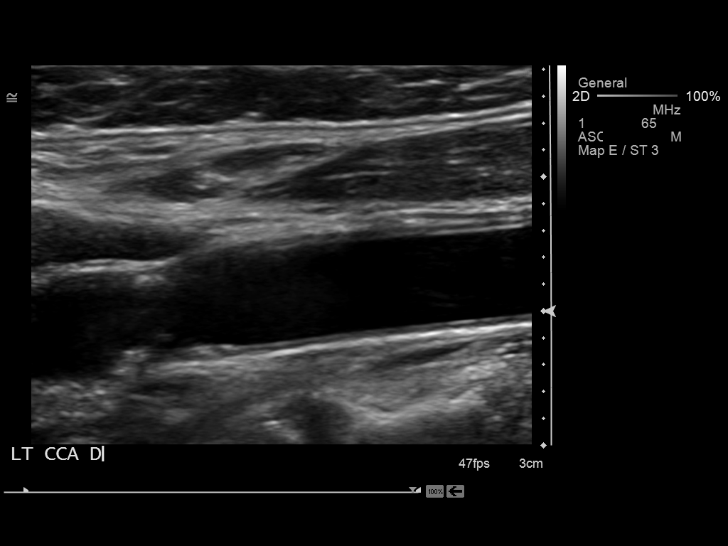
[im 62/95]
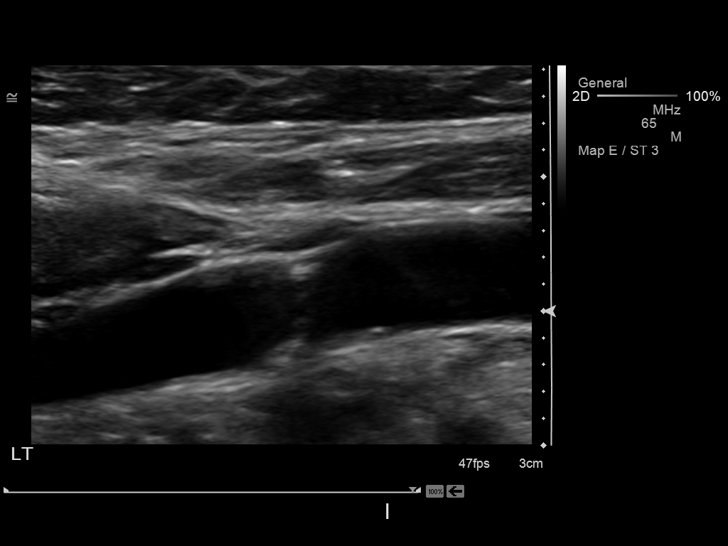
[im 70/95]
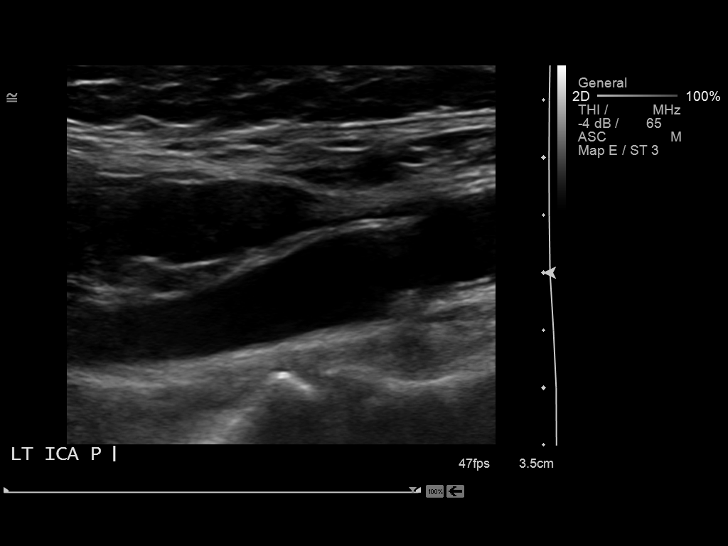
[im 78/95]
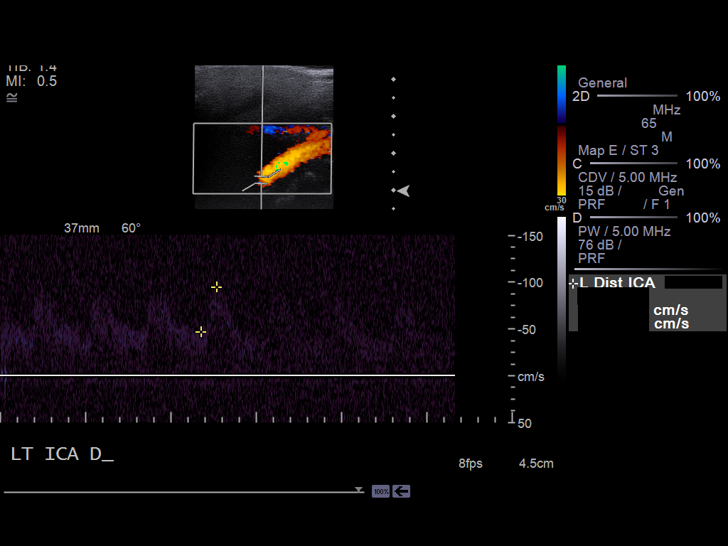
[im 86/95]
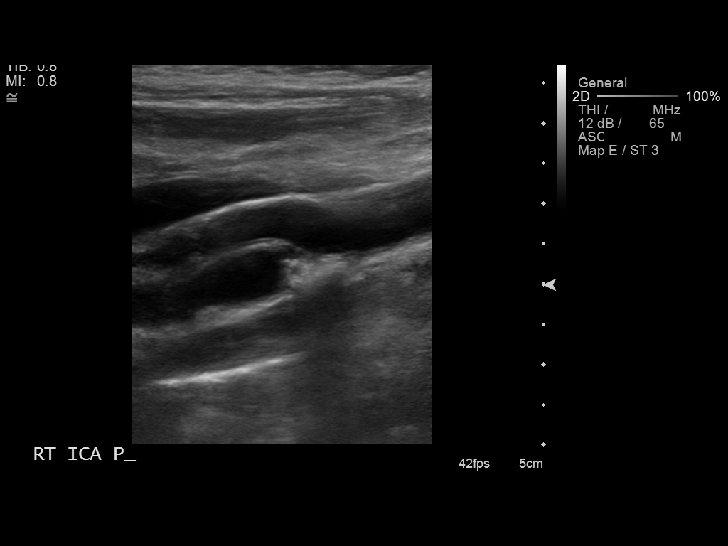
[im 95/95]
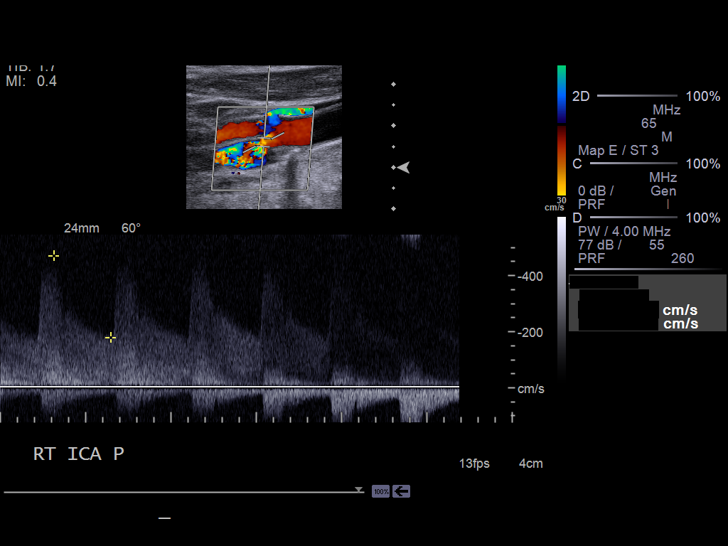

[13 of 24 positions shown; findings below may reference images not displayed]

Criteria:  Quantification of carotid stenosis is based on velocity
parameters that correlate the residual internal carotid diameter
with NASCET-based stenosis levels, using the diameter of the distal
internal carotid lumen as the denominator for stenosis measurement.

The following velocity measurements were obtained:

                 PEAK SYSTOLIC/END DIASTOLIC
RIGHT
ICA:                        423/165cm/sec
CCA:                        94/26cm/sec
SYSTOLIC ICA/CCA RATIO:
DIASTOLIC ICA/CCA RATIO:
ECA:                        113cm/sec

LEFT
ICA:                        119/46cm/sec
CCA:                        91/22cm/sec
SYSTOLIC ICA/CCA RATIO:
DIASTOLIC ICA/CCA RATIO:
ECA:                        114cm/sec
FINDINGS: RIGHT CAROTID ARTERY: Minimal intimal thickening CCA.  Mild
tortuosity of right carotid system.  Plaque formation identified at
distal right carotid bulb extending to the origin of the right ICA.
High velocity jet identified at proximal right ICA.  Turbulent
blood flow on color Doppler [HOSPITAL] distal right carotid bulb
and proximal right ICA with spectral broadening on waveform
analysis.

RIGHT VERTEBRAL ARTERY:  Patent, antegrade

LEFT CAROTID ARTERY: Mild intimal thickening left CCA. Mild plaque
formation left carotid bulb, portion of which is calcified
shadowing.  Turbulent blood flow and spectral broadening identified
at distal left carotid bulb and left ICA on waveform analysis.  No
discrete high velocity jets.

LEFT VERTEBRAL ARTERY:  Patent, antegrade
IMPRESSION: Plaque formation bilaterally at the carotid bulbs extending into
the proximal right ICA.
Velocities obtained in the right ICA correspond to a greater than
70% but less than near-occlusion stenosis.
Velocities obtained in the left ICA correspond to a less than 50%
diameter stenosis.

Findings called to Issa Ventura PA on 10/02/2011 at 8848 hours.

## 2014-05-05 IMAGING — CR DG CHEST 2V
2 series · 2 of 2 positions shown · non-contrast
Comparison: None.

CLINICAL DATA: Preop for endarterectomy

CHEST - 2 VIEW

[view not recorded (1 of 2)]
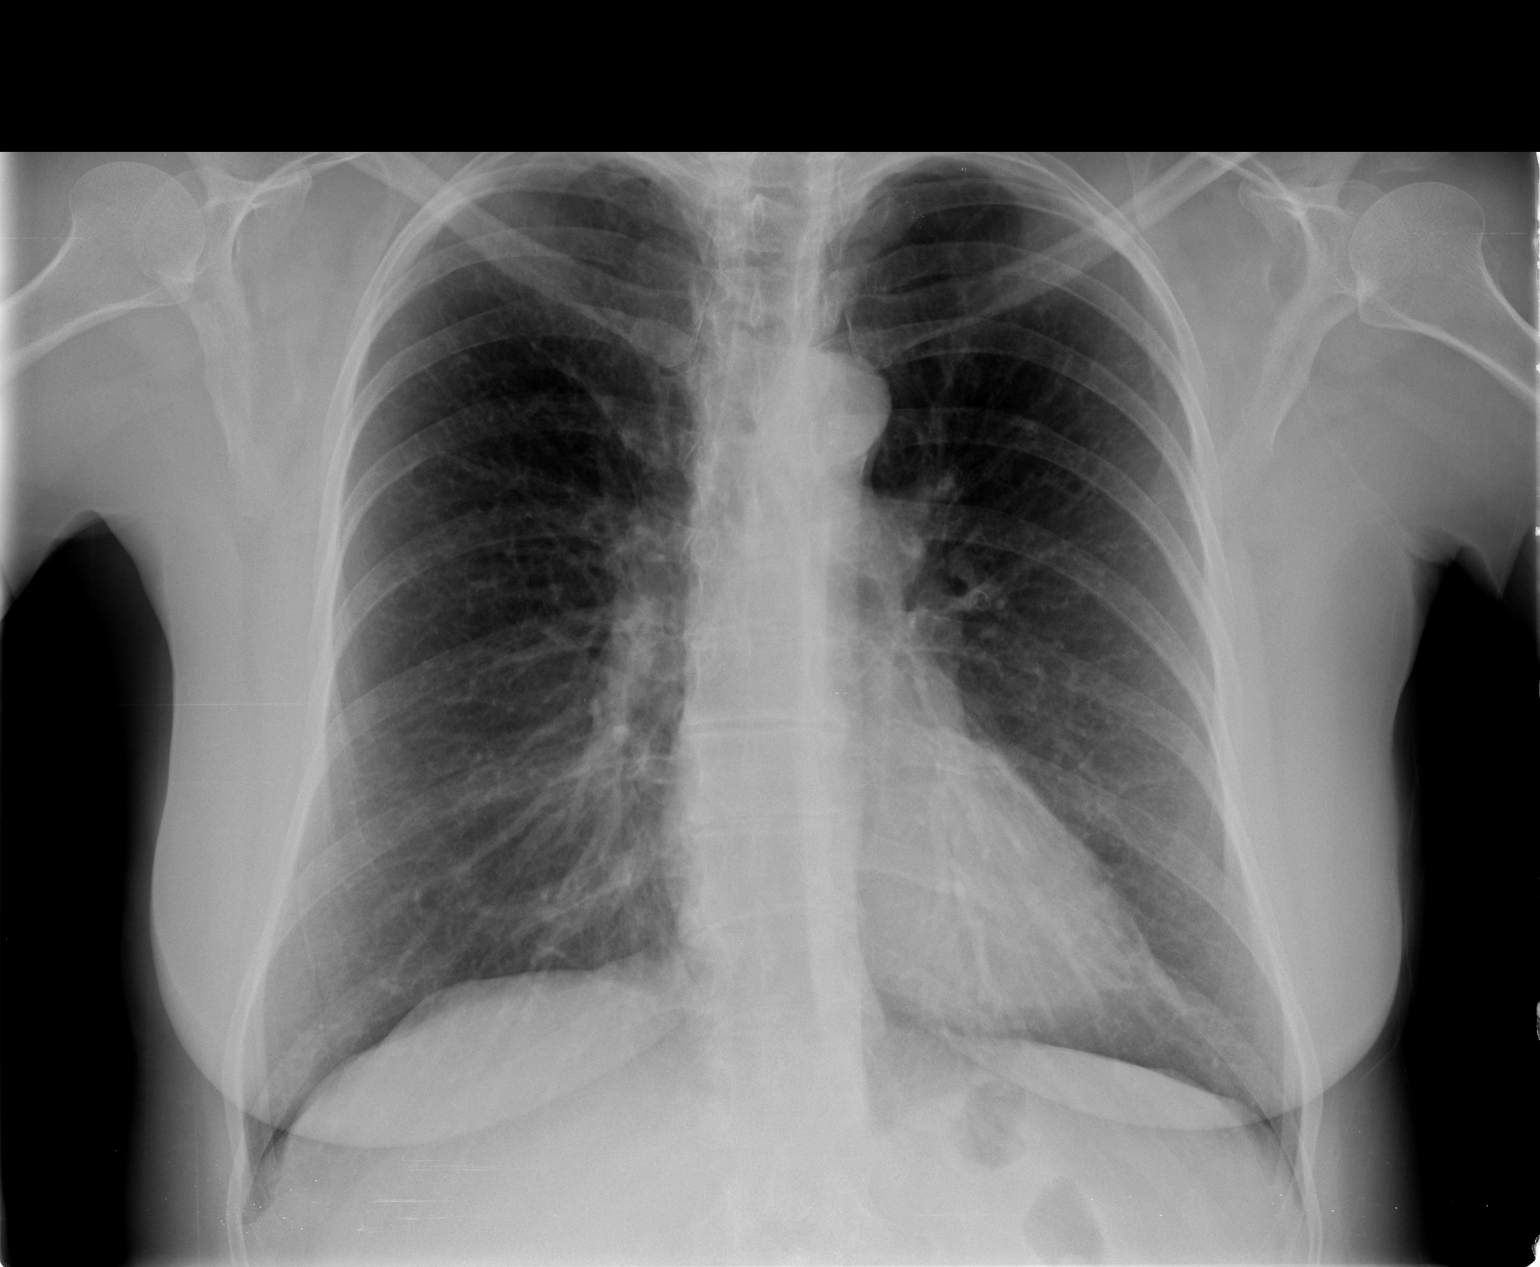

[view not recorded (2 of 2)]
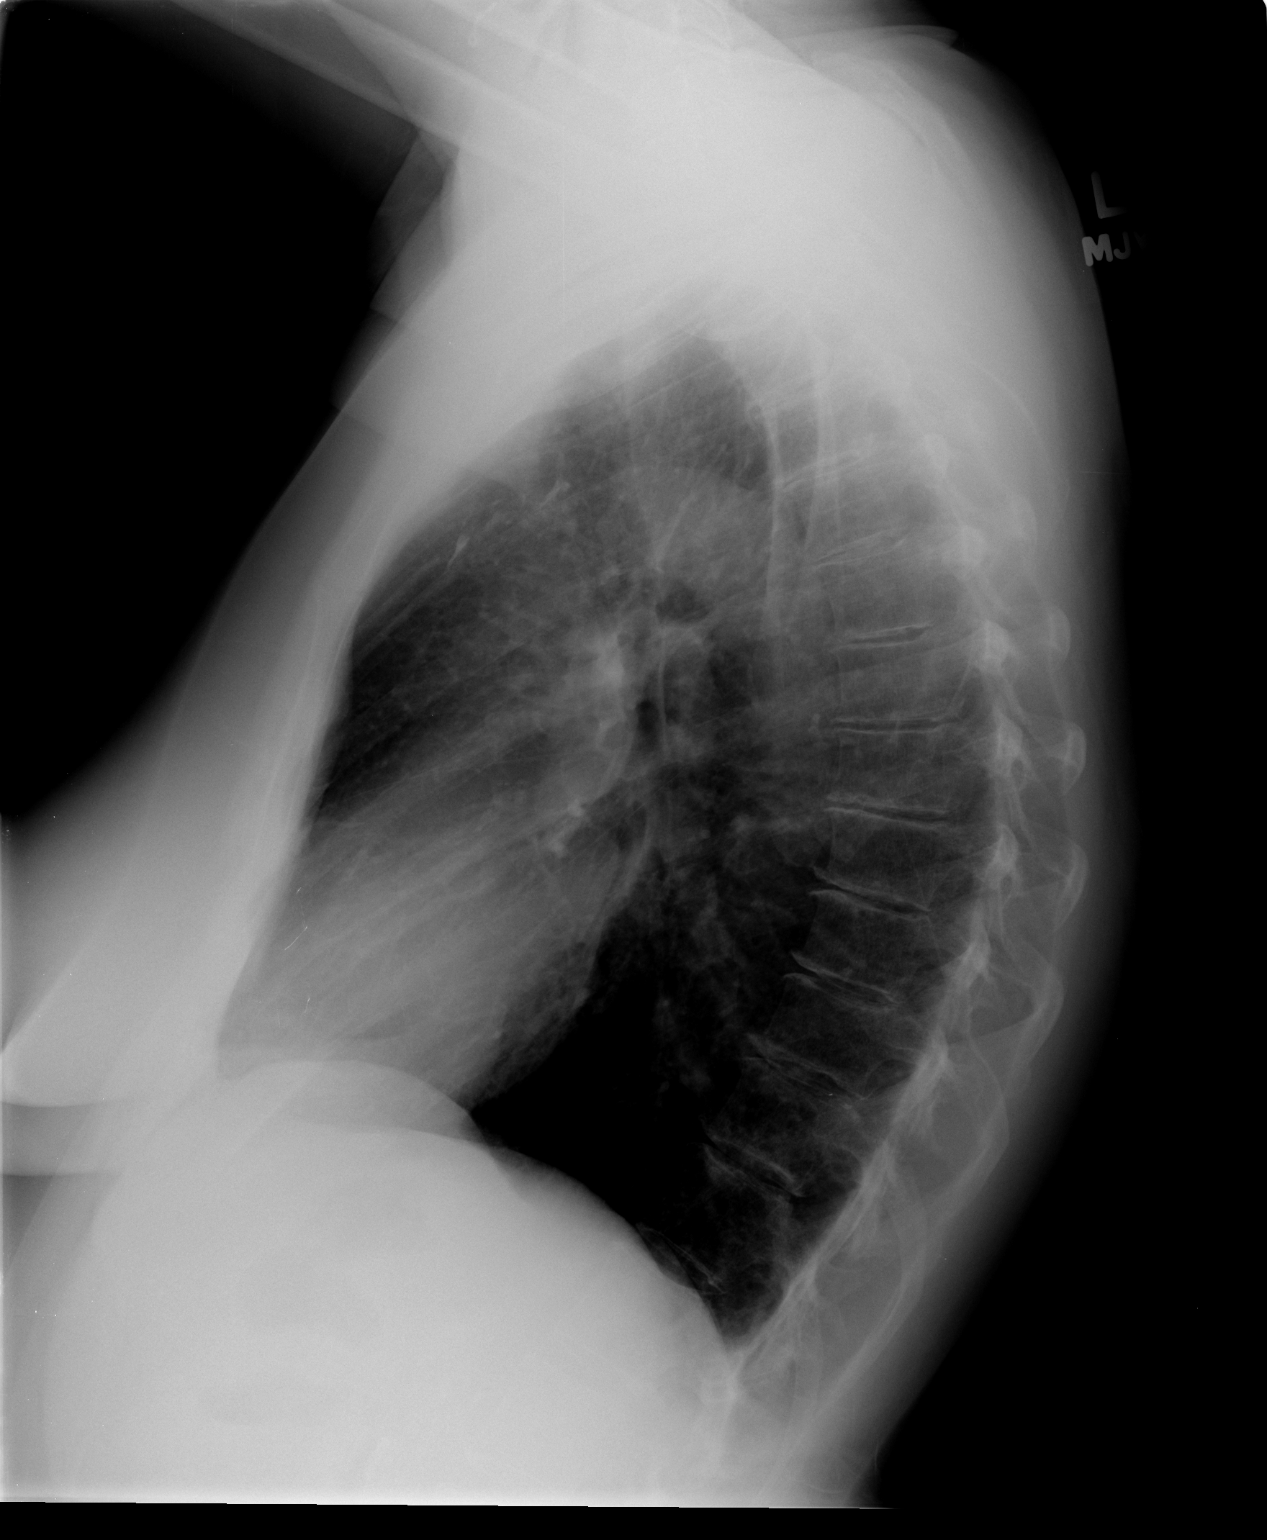

[2 of 2 positions shown; findings below may reference images not displayed]

FINDINGS: Cardiomediastinal silhouette is unremarkable.  No acute
infiltrate or pleural effusion.  No pulmonary edema.  Mild
degenerative changes thoracic spine.
IMPRESSION: No active disease.  Mild degenerative changes thoracic spine.

## 2014-05-06 ENCOUNTER — Encounter: Payer: Self-pay | Admitting: Neurology

## 2014-05-07 ENCOUNTER — Ambulatory Visit (INDEPENDENT_AMBULATORY_CARE_PROVIDER_SITE_OTHER): Payer: BC Managed Care – PPO | Admitting: Neurology

## 2014-05-07 ENCOUNTER — Encounter: Payer: Self-pay | Admitting: Neurology

## 2014-05-07 VITALS — BP 123/71 | HR 93 | Ht 66.0 in | Wt 181.0 lb

## 2014-05-07 DIAGNOSIS — G4733 Obstructive sleep apnea (adult) (pediatric): Secondary | ICD-10-CM

## 2014-05-07 DIAGNOSIS — E663 Overweight: Secondary | ICD-10-CM | POA: Diagnosis not present

## 2014-05-07 DIAGNOSIS — Z9989 Dependence on other enabling machines and devices: Principal | ICD-10-CM

## 2014-05-07 NOTE — Progress Notes (Signed)
Subjective:    Patient ID: Rebecca Mathis is a 59 y.o. female.  HPI     Interim history:   [name] is a 59 year old right-handed woman with an underlying medical history of hypertension, hyperlipidemia, anxiety, reflux disease, carotid artery disease, status post right carotid endarterectomy with patch angioplasty on 02/01/2012, who presents for follow-up consultation of her obstructive sleep apnea, now on treatment with BiPAP therapy after her to sleep studies. The patient is unaccompanied today. I first met her on 12/09/2013 at the request of her primary care provider, at which time the patient reported snoring, excessive daytime somnolence and witnessed apneas. Her family had noted apneic spells and her sister had recently been diagnosed with OSA and RLS.  I invited her back for sleep study. She had a baseline sleep study on 12/10/2013, followed by a CPAP titration study on 12/23/2013, and I went over her test results with her in detail today. Her baseline sleep study from 12/10/2013 showed a sleep efficiency of 72.1% with a latency to sleep at 66.5 minutes and wake after sleep onset of 46.5 minutes. She had an elevated percentage of stage II sleep, and a reduced percentage of REM sleep with a prolonged REM latency. She had no significant PLMS or EKG or EEG changes. She had moderate to loud snoring. Her total AHI was 15.9 per hour, rising to 48.3 per hour during REM sleep and 34 per hour in the supine position. Baseline oxygen saturation was only 89% or right at 90%, nadir was 78%. Based on her test results I invited her back for CPAP titration to help treat her OSA. Her CPAP titration study from 12/23/2013 showed a sleep efficiency of 78.8%, latency to sleep of 40 minutes and wake after sleep onset of 42 minutes. She had a mildly elevated percentage of stage II sleep, and a mildly decreased percentage of REM sleep with a mildly prolonged REM latency at 122 minutes. She had no significant PLMS,  EKG or EEG changes. She was started on CPAP at 5 cm and titrated to a pressure of 15 cm. She had residual sleep disordered breathing and therefore was switched to BiPAP therapy. She was titrated from 16/12 cm to 19/15 cm. On the final pressure she had an AHI of 0 per hour. She preferred a full facemask. Based on the test results I prescribed stand or BiPAP therapy for her at a pressure of 19/15.  Today, 05/07/2014: I reviewed her BiPAP compliance data from 04/05/2014 through 05/04/2014 which is a total of 30 days during which time she used her machine every night with percent used days greater than 4 hours at 97%, indicating excellent compliance with an average usage of 5 hours and 32 minutes and residual AHI at 2.3 per hour but leak high. Pressure 19/15 cm. 95th percentile of leak was at 115.5 L/m.  Today, 05/07/2014: She reports she has adjusted to treatment. She overall sleeps better and is compliant with treatment. She does notice the higher leak. She likes to sleep on her side. She is working on weight loss. She is worried about her oxygen saturations in the high leak. Sometimes she has difficulty with sleep onset but overall she is doing fairly well. She is using a full facemask.  Previously:  She works as a Medical illustrator. She quit smoking some 2 years ago, she drinks one coffee per day, no sodas, rare alcohol. She denies restless leg symptoms and is not aware of any leg twitching or kicking in her  sleep. She has woken herself up with a gasping sensation and feels that sometimes she wakes up with her throat closing.   Her typical bedtime is reported to be around 10 PM to MN and usual wake time is around 6 to 7 AM. Sleep onset typically occurs within minutes. She reports feeling adequately rested upon awakening. She wakes up on an average 1 times in the middle of the night and has to go to the bathroom 1 times on a typical night. She denies morning headaches. Her ESS is 2/24. She does not watch TV  in bed. She lives alone. She has no pets. 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  Her Past Medical History Is Significant For: Past Medical History  Diagnosis Date  . Carotid artery occlusion   . Heart murmur   . Hypertension   . Dyslipidemia   . Hyperlipidemia   . Anxiety   . GERD (gastroesophageal reflux disease)     INDIGESTION W/ SOME FOODS    . Diabetes mellitus without complication     during pregnancy    Her Past Surgical History Is Significant For: Past Surgical History  Procedure Laterality Date  . Cesarean section  1984  . Endarterectomy  02/01/2012    Procedure: ENDARTERECTOMY CAROTID;  Surgeon: Serafina Mitchell, MD;  Location: [place];  Service: Vascular;  Laterality: Right;  . Patch angioplasty  02/01/2012    Procedure: PATCH ANGIOPLASTY;  Surgeon: Serafina Mitchell, MD;  Location: [facility] OR;  Service: Vascular;  Laterality: Right;  Using 1 x 6 cm vascu- guard patch   . Carotid endarterectomy Right 02-01-12    cea  . Caratoid artery  01/14    Her Family History Is Significant For: Family History  Problem Relation Age of Onset  . Coronary artery disease Mother   . Cancer Mother     ovarian  . Diabetes Mother   . Heart disease Mother   . Heart attack Mother   . Coronary artery disease Father   . Other Father     varicose veins  . Heart disease Sister   . Hypertension Sister   . Heart disease Brother   . Diabetes Brother   . Restless legs syndrome Sister     Her Social History Is Significant For: History   Social History  . Marital Status: Single    Spouse Name: N/A  . Number of Children: 1  . Years of Education: masters   Occupational History  .      retired   Social History Main Topics  . Smoking status: Former Smoker    Quit date: 09/18/2011  . Smokeless tobacco: Never Used  . Alcohol Use: No  . Drug Use: No  . Sexual Activity: Not on file   Other Topics Concern  . None   Social History Narrative   Patient lives at home alone  she is single.   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   Right handed.   Caffeine one cup of coffee daily.    Her Allergies Are:  Allergies  Allergen Reactions  . Lipitor [Atorvastatin] Nausea Only  . Penicillins Nausea Only  . Lidocaine Other (See Comments)    Temporary vision loss  . Restylane-L [Hyaluronic Acid-Lidocaine]     TEMPORARY VISION  LOSS   :   Her Current Medications Are:  Outpatient Encounter Prescriptions as of 05/07/2014  Medication Sig  . aspirin 325 MG tablet Take  0.5 mg by mouth daily. Patient states she takes half at night.  Marland Kitchen b complex vitamins tablet Take 1 tablet by mouth daily.  . Biotin 5000 MCG CAPS Take by mouth.  . Cholecalciferol (D3 ADULT PO) Take 2,000 mcg by mouth daily.  Marland Kitchen CINNAMON PO Take by mouth.  . fish oil-omega-3 fatty acids 1000 MG capsule Take 1 g by mouth daily.  . hydrochlorothiazide (HYDRODIURIL) 25 MG tablet Take 25 mg by mouth daily. 1/2 tablet daily  . NORVASC 10 MG tablet Take by mouth daily.  . Nutritional Supplements (JUICE PLUS FIBRE PO) Take by mouth 2 (two) times daily. Takes 6 tablets  daily  . rosuvastatin (CRESTOR) 5 MG tablet Take 1 tablet (5 mg total) by mouth daily.  :  Review of Systems:  Out of a complete 14 point review of systems, all are reviewed and negative with the exception of these symptoms as listed below:   Review of Systems  Psychiatric/Behavioral: Positive for sleep disturbance.       Patient is having trouble with her CPAP. Patient is having trouble going to sleep sometimes.    Objective:  Neurologic Exam  Physical Exam Physical Examination:   Filed Vitals:   05/07/14 1326  BP: 123/71  Pulse: 93    General Examination: The patient is a very pleasant 59 y.o. female in no acute distress. She appears well-developed and well-nourished and very well groomed.   HEENT: Normocephalic, atraumatic, pupils are equal, round and reactive to light and  accommodation. Funduscopic exam is normal with sharp disc margins noted. Extraocular tracking is good without limitation to gaze excursion or nystagmus noted. Normal smooth pursuit is noted. Hearing is grossly intact. Tympanic membranes are clear bilaterally. Face is symmetric with normal facial animation and normal facial sensation. Speech is clear with no dysarthria noted. There is no hypophonia. There is no lip, neck/head, jaw or voice tremor. Neck is supple with full range of passive and active motion. There are no carotid bruits on auscultation. Oropharynx exam reveals: mild mouth dryness, adequate dental hygiene and moderate airway crowding, due to narrow airway entry, tonsils in place and redundant soft palate. I did not see the tip of her uvula. Mallampati is class III. Tongue protrudes centrally and palate elevates symmetrically. She has a Mild overbite. Nasal inspection reveals no significant nasal mucosal bogginess or redness and no septal deviation.   Chest: Clear to auscultation without wheezing, rhonchi or crackles noted.  Heart: S1+S2+0, regular and a 3/6 systolic murmur.   Abdomen: Soft, non-tender and non-distended with normal bowel sounds appreciated on auscultation.  Extremities: There is no pitting edema in the distal lower extremities bilaterally. Pedal pulses are intact.  Skin: Warm and dry without trophic changes noted. There are no varicose veins.  Musculoskeletal: exam reveals no obvious joint deformities, tenderness or joint swelling or erythema.   Neurologically:  Mental status: The patient is awake, alert and oriented in all 4 spheres. Her immediate and remote memory, attention, language skills and fund of knowledge are appropriate. There is no evidence of aphasia, agnosia, apraxia or anomia. Speech is clear with normal prosody and enunciation. Thought process is linear. Mood is normal and affect is normal.  Cranial nerves II - XII are as described above under HEENT exam.  In addition: shoulder shrug is normal with equal shoulder height noted. Motor exam: Normal bulk, strength and tone is noted. There is no drift, tremor or rebound. Romberg is negative. Reflexes are 2+ throughout. Fine motor skills and  coordination: intact with normal finger taps, normal hand movements, normal rapid alternating patting, normal foot taps and normal foot agility.  Cerebellar testing: No dysmetria or intention tremor on finger to nose testing. Heel to shin is unremarkable bilaterally. There is no truncal or gait ataxia.  Sensory exam: intact to light touch in the upper and lower extremities.  Gait, station and balance: She stands easily. No veering to one side is noted. No leaning to one side is noted. Posture is age-appropriate and stance is narrow based. Gait shows normal stride length and normal pace. No problems turning are noted. She turns en bloc. Tandem walk is unremarkable.   Assessment and Plan:   In summary, JENAH VANASTEN is a very pleasant 59 year old female with an underlying medical history of hypertension, hyperlipidemia, anxiety, reflux disease, carotid artery disease, status post right carotid endarterectomy with patch angioplasty on 02/01/2012, who presents for follow-up consultation of her moderate obstructive sleep apnea, now on treatment with BiPAP therapy with reasonable results overall but she has  some trouble initiating sleep at times and leak is quite high. She is aware of the higher leak and is trying everything that is under her control to help with mask fitting. She is compliant with treatment as and is commended for this. We talked about her sleep study results from her baseline sleep study from 12/10/2013 as well as her CPAP titration study from 12/23/2013 today. I explained her test results in detail and also explained her compliance data. Her physical exam is stable.  I again explained the risks and ramifications of untreated moderate to severe OSA, especially  with respect to developing cardiovascular disease down the Road, including congestive heart failure, difficult to treat hypertension, cardiac arrhythmias, or stroke. Even type 2 diabetes has, in part, been linked to untreated OSA. Symptoms of untreated OSA include daytime sleepiness, memory problems, mood irritability and mood disorder such as depression and anxiety, lack of energy, as well as recurrent headaches, especially morning headaches. We talked about trying to maintain a healthy lifestyle in general, as well as the importance of weight control. I encouraged the patient to eat healthy, exercise daily and keep well hydrated, to keep a scheduled bedtime and wake time routine, to not skip any meals and eat healthy snacks in between meals. I advised the patient not to drive when feeling sleepy.  I recommended the following at this time: continue BiPAP therapy, but I would like for her to meet with her DME company representative to help troubleshoot her air leaking from the mask. I would like to do a 30 day trial of autoBiPAP with a download afterwards. We may also do an ONO down the road, once we have the leak under control. She may need a different mask, such as a nasal mask.   I explained the importance of being compliant with PAP treatment, not only for insurance purposes but primarily to improve Her symptoms, and for the patient's long term health benefit, including to reduce Her cardiovascular risks.  I would like to see her back in 3 months, sooner if the need arises. I will be on the look out for another compliance download to see if the leak improves. I answered all her questions today and the patient was in agreement.  I spent 25 minutes in total face-to-face time with the patient, more than 50% of which was spent in counseling and coordination of care, reviewing test results, reviewing medication and discussing or reviewing the diagnosis of OSA,  its prognosis and treatment options.

## 2014-05-07 NOTE — Patient Instructions (Signed)
We will try autoBiPAP for 30 days and I will look at the download and we will take it from there.

## 2014-07-14 ENCOUNTER — Encounter: Payer: Self-pay | Admitting: *Deleted

## 2014-08-17 ENCOUNTER — Ambulatory Visit: Payer: BC Managed Care – PPO | Admitting: Neurology

## 2014-08-20 ENCOUNTER — Encounter (HOSPITAL_COMMUNITY): Payer: Self-pay

## 2014-08-20 ENCOUNTER — Emergency Department (HOSPITAL_COMMUNITY)
Admission: EM | Admit: 2014-08-20 | Discharge: 2014-08-20 | Disposition: A | Discharge status: Home / Self Care | Payer: BC Managed Care – PPO | Attending: Emergency Medicine | Admitting: Emergency Medicine

## 2014-08-20 DIAGNOSIS — R42 Dizziness and giddiness: Secondary | ICD-10-CM | POA: Insufficient documentation

## 2014-08-20 DIAGNOSIS — R110 Nausea: Secondary | ICD-10-CM | POA: Insufficient documentation

## 2014-08-20 DIAGNOSIS — R509 Fever, unspecified: Secondary | ICD-10-CM | POA: Diagnosis not present

## 2014-08-20 DIAGNOSIS — Z7982 Long term (current) use of aspirin: Secondary | ICD-10-CM | POA: Insufficient documentation

## 2014-08-20 DIAGNOSIS — M542 Cervicalgia: Secondary | ICD-10-CM | POA: Insufficient documentation

## 2014-08-20 DIAGNOSIS — Z8719 Personal history of other diseases of the digestive system: Secondary | ICD-10-CM | POA: Diagnosis not present

## 2014-08-20 DIAGNOSIS — E785 Hyperlipidemia, unspecified: Secondary | ICD-10-CM | POA: Insufficient documentation

## 2014-08-20 DIAGNOSIS — Z9862 Peripheral vascular angioplasty status: Secondary | ICD-10-CM | POA: Insufficient documentation

## 2014-08-20 DIAGNOSIS — Z8659 Personal history of other mental and behavioral disorders: Secondary | ICD-10-CM | POA: Insufficient documentation

## 2014-08-20 DIAGNOSIS — Z880 Allergy status to penicillin: Secondary | ICD-10-CM | POA: Diagnosis not present

## 2014-08-20 DIAGNOSIS — I10 Essential (primary) hypertension: Secondary | ICD-10-CM | POA: Diagnosis not present

## 2014-08-20 DIAGNOSIS — R011 Cardiac murmur, unspecified: Secondary | ICD-10-CM | POA: Diagnosis not present

## 2014-08-20 DIAGNOSIS — Z87891 Personal history of nicotine dependence: Secondary | ICD-10-CM | POA: Insufficient documentation

## 2014-08-20 DIAGNOSIS — Z79899 Other long term (current) drug therapy: Secondary | ICD-10-CM | POA: Diagnosis not present

## 2014-08-20 LAB — CBC WITH DIFFERENTIAL/PLATELET
Basophils Absolute: 0.1 10*3/uL (ref 0.0–0.1)
Basophils Relative: 1 % (ref 0–1)
Eosinophils Absolute: 0.2 10*3/uL (ref 0.0–0.7)
Eosinophils Relative: 2 % (ref 0–5)
HCT: 44.5 % (ref 36.0–46.0)
HEMOGLOBIN: 15.3 g/dL — AB (ref 12.0–15.0)
Lymphocytes Relative: 26 % (ref 12–46)
Lymphs Abs: 2.8 10*3/uL (ref 0.7–4.0)
MCH: 31.5 pg (ref 26.0–34.0)
MCHC: 34.4 g/dL (ref 30.0–36.0)
MCV: 91.8 fL (ref 78.0–100.0)
MONOS PCT: 10 % (ref 3–12)
Monocytes Absolute: 1.1 10*3/uL — ABNORMAL HIGH (ref 0.1–1.0)
Neutro Abs: 6.9 10*3/uL (ref 1.7–7.7)
Neutrophils Relative %: 61 % (ref 43–77)
Platelets: 245 10*3/uL (ref 150–400)
RBC: 4.85 MIL/uL (ref 3.87–5.11)
RDW: 11.9 % (ref 11.5–15.5)
WBC: 11 10*3/uL — ABNORMAL HIGH (ref 4.0–10.5)

## 2014-08-20 LAB — BASIC METABOLIC PANEL
ANION GAP: 10 (ref 5–15)
BUN: 7 mg/dL (ref 6–20)
CHLORIDE: 99 mmol/L — AB (ref 101–111)
CO2: 31 mmol/L (ref 22–32)
CREATININE: 0.54 mg/dL (ref 0.44–1.00)
Calcium: 9.8 mg/dL (ref 8.9–10.3)
GFR calc Af Amer: >60 mL/min (ref >60)
GFR calc non Af Amer: >60 mL/min (ref >60)
Glucose, Bld: 145 mg/dL — ABNORMAL HIGH (ref 65–99)
POTASSIUM: 4.6 mmol/L (ref 3.5–5.1)
Sodium: 140 mmol/L (ref 135–145)

## 2014-08-20 LAB — TROPONIN I: Troponin I: <0.03 ng/mL (ref <0.031)

## 2014-08-20 MED ORDER — MECLIZINE HCL 25 MG PO TABS: 25.0000 mg | ORAL_TABLET | Freq: Three times a day (TID) | ORAL | Status: DC | PRN

## 2014-08-20 MED ORDER — SODIUM CHLORIDE 0.9 % IV SOLN: 1000.0000 mL | INTRAVENOUS | Status: DC

## 2014-08-20 MED ORDER — ONDANSETRON 4 MG PO TBDP
4.0000 mg | ORAL_TABLET | Freq: Once | ORAL | Status: AC
  Administered 2014-08-20: 4 mg via ORAL
  Filled 2014-08-20: qty 1

## 2014-08-20 MED ORDER — ONDANSETRON HCL 4 MG PO TABS: 4.0000 mg | ORAL_TABLET | Freq: Four times a day (QID) | ORAL | Status: DC | PRN

## 2014-08-20 MED ORDER — SODIUM CHLORIDE 0.9 % IV SOLN: 1000.0000 mL | Freq: Once | INTRAVENOUS | Status: DC

## 2014-08-20 MED ORDER — ONDANSETRON HCL 4 MG/2ML IJ SOLN: 4.0000 mg | Freq: Once | INTRAMUSCULAR | Status: DC

## 2014-08-20 MED ORDER — MECLIZINE HCL 12.5 MG PO TABS
25.0000 mg | ORAL_TABLET | Freq: Once | ORAL | Status: AC
  Administered 2014-08-20: 25 mg via ORAL
  Filled 2014-08-20: qty 2

## 2014-08-20 NOTE — ED Provider Notes (Signed)
CSN: [id number]     Arrival date & time 08/20/14  0054 History   First MD Initiated Contact with Patient 08/20/14 0146     Chief Complaint  Patient presents with  . Neck Pain     (Consider location/radiation/quality/duration/timing/severity/associated sxs/prior Treatment) Patient is a 59 y.o. female presenting with neck pain. The history is provided by the patient.  Neck Pain She states that she has not felt well for about the last 9 days. She started with a low-grade fever and nausea. She states she couldn't eat for several days. Nausea still present but she has now able to tolerate some fluids and solid food. She's no longer running fevers. She initially had a sense like there was muscle spasm on the right side of her neck. That had subsided and then recurred today. She does relate she is having some dizziness. Dizziness is both spinning sensation and lightheadedness. It is worse when she bends over and then when she stands back up. She denies chest pain, heaviness, tightness, pressure. She denies dyspnea. Of note, she is status post right carotid endarterectomy. She also relates that a family member who had been with her prior to the onset of her illness also had problems with fever and nausea.  Past Medical History  Diagnosis Date  . Carotid artery occlusion   . Heart murmur   . Hypertension   . Dyslipidemia   . Hyperlipidemia   . Anxiety   . GERD (gastroesophageal reflux disease)     INDIGESTION W/ SOME FOODS    . Diabetes mellitus without complication     during pregnancy   Past Surgical History  Procedure Laterality Date  . Cesarean section  1984  . Endarterectomy  02/01/2012    Procedure: ENDARTERECTOMY CAROTID;  Surgeon: Serafina Mitchell, MD;  Location: [place];  Service: Vascular;  Laterality: Right;  . Patch angioplasty  02/01/2012    Procedure: PATCH ANGIOPLASTY;  Surgeon: Serafina Mitchell, MD;  [facility] OR;  Service: Vascular;  Laterality: Right;  Using 1 x 6 cm vascu-  guard patch   . Carotid endarterectomy Right 02-01-12    cea  . Caratoid artery  01/14  . Nm myocar perf ejection fraction  11/01/2011    The post stress myocardial perfusion images show a normal pattern of perfusion in all regions. The post stress left ventricle is normal in size. The post-stress ejection fraction is 84%. No significant wall motion abnormalities noted.   Family History  Problem Relation Age of Onset  . Coronary artery disease Mother   . Cancer Mother     ovarian  . Diabetes Mother   . Heart disease Mother   . Heart attack Mother   . Coronary artery disease Father   . Other Father     varicose veins  . Heart disease Sister   . Hypertension Sister   . Heart disease Brother   . Diabetes Brother   . Restless legs syndrome Sister    Social History  Substance Use Topics  . Smoking status: Former Smoker    Quit date: 09/18/2011  . Smokeless tobacco: Never Used  . Alcohol Use: No   OB History    No data available     Review of Systems  Musculoskeletal: Positive for neck pain.  All other systems reviewed and are negative.     Allergies  Lipitor; Penicillins; Lidocaine; and Restylane-l  Home Medications   Prior to Admission medications   Medication Sig Start Date End Date  Taking? Authorizing Provider  aspirin 325 MG tablet Take 0.5 mg by mouth daily. Patient states she takes half at night.   Yes Historical Provider, MD  b complex vitamins tablet Take 1 tablet by mouth daily.   Yes Historical Provider, MD  Biotin 5000 MCG CAPS Take by mouth.   Yes Historical Provider, MD  Cholecalciferol (D3 ADULT PO) Take 2,000 mcg by mouth daily.   Yes Historical Provider, MD  co-enzyme Q-10 30 MG capsule Take 30 mg by mouth 3 (three) times daily.   Yes Historical Provider, MD  hydrochlorothiazide (HYDRODIURIL) 25 MG tablet Take 25 mg by mouth daily. 1/2 tablet daily 08/24/11  Yes Historical Provider, MD  milk thistle 175 MG tablet Take 175 mg by mouth daily.   Yes  Historical Provider, MD  NORVASC 10 MG tablet Take by mouth daily. 08/24/11  Yes Historical Provider, MD  Nutritional Supplements (JUICE PLUS FIBRE PO) Take by mouth 2 (two) times daily. Takes 6 tablets  daily   Yes Historical Provider, MD  rosuvastatin (CRESTOR) 5 MG tablet Take 1 tablet (5 mg total) by mouth daily. 06/21/12  Yes Pixie Casino, MD  Vitamin D-Vitamin K (K2 PLUS D3 PO) Take by mouth.   Yes Historical Provider, MD  CINNAMON PO Take by mouth.    Historical Provider, MD  fish oil-omega-3 fatty acids 1000 MG capsule Take 1 g by mouth daily.    Historical Provider, MD   BP 160/71 mmHg  Pulse 79  Temp(Src) 98.2 F (36.8 C) (Oral)  Resp 13  Ht 5\' 6"  (1.676 m)  Wt 185 lb (83.915 kg)  BMI 29.87 kg/m2  SpO2 99% Physical Exam  Nursing note and vitals reviewed.  59 year old female, resting comfortably and in no acute distress. Vital signs are significant for hypertension. Oxygen saturation is 99%, which is normal. Head is normocephalic and atraumatic. PERRLA, EOMI. Oropharynx is clear. Neck is nontender and supple without adenopathy or JVD. There are no carotid bruits. Back is nontender and there is no CVA tenderness. Lungs are clear without rales, wheezes, or rhonchi. Chest is nontender. Heart has regular rate and rhythm without murmur. Abdomen is soft, flat, nontender without masses or hepatosplenomegaly and peristalsis is normoactive. Extremities have no cyanosis or edema, full range of motion is present. Skin is warm and dry without rash. Neurologic: Mental status is normal, cranial nerves are intact, there are no motor or sensory deficits. There is no nystagmus. Dizziness is not reproduced by passive head movement.  ED Course  Procedures (including critical care time) Labs Review Results for orders placed or performed during the hospital encounter of 24/09/73  Basic metabolic panel  Result Value Ref Range   Sodium 140 135 - 145 mmol/L   Potassium 4.6 3.5 - 5.1 mmol/L    Chloride 99 (L) 101 - 111 mmol/L   CO2 31 22 - 32 mmol/L   Glucose, Bld 145 (H) 65 - 99 mg/dL   BUN 7 6 - 20 mg/dL   Creatinine, Ser 0.54 0.44 - 1.00 mg/dL   Calcium 9.8 8.9 - 10.3 mg/dL   GFR calc non Af Amer >60 >60 mL/min   GFR calc Af Amer >60 >60 mL/min   Anion gap 10 5 - 15  CBC with Differential  Result Value Ref Range   WBC 11.0 (H) 4.0 - 10.5 K/uL   RBC 4.85 3.87 - 5.11 MIL/uL   Hemoglobin 15.3 (H) 12.0 - 15.0 g/dL   HCT 44.5 36.0 - 46.0 %  MCV 91.8 78.0 - 100.0 fL   MCH 31.5 26.0 - 34.0 pg   MCHC 34.4 30.0 - 36.0 g/dL   RDW 11.9 11.5 - 15.5 %   Platelets 245 150 - 400 K/uL   Neutrophils Relative % 61 43 - 77 %   Neutro Abs 6.9 1.7 - 7.7 K/uL   Lymphocytes Relative 26 12 - 46 %   Lymphs Abs 2.8 0.7 - 4.0 K/uL   Monocytes Relative 10 3 - 12 %   Monocytes Absolute 1.1 (H) 0.1 - 1.0 K/uL   Eosinophils Relative 2 0 - 5 %   Eosinophils Absolute 0.2 0.0 - 0.7 K/uL   Basophils Relative 1 0 - 1 %   Basophils Absolute 0.1 0.0 - 0.1 K/uL  Troponin I  Result Value Ref Range   Troponin I <0.03 <0.031 ng/mL    EKG Interpretation   Date/Time:  Thursday August 20 2014 01:07:46 EDT Ventricular Rate:  85 PR Interval:  205 QRS Duration: 142 QT Interval:  428 QTC Calculation: 509 R Axis:   91 Text Interpretation:  Sinus rhythm RAE, consider biatrial enlargement RBBB  and LPFB When compared with ECG of 01/19/2012, No significant change was  found Confirmed by [facility]  MD, Floyce Bujak ([id number]) on 08/20/2014 1:16:12 AM      MDM   Final diagnoses:  Vertigo    Dizziness of uncertain cause. There are no findings to suspect central vertigo. Screening labs will be obtained and she will be given IV fluids the therapeutic trial of meclizine.  She feels much better after meclizine and ondansetron. IVs were not actually given and ondansetron was actually given as an oral disintegrating tablet. Orthostatic vital signs show an increase in heart rate and decrease in blood pressure although  not at the level of significance. The fact that she feels better with just meclizine is strongly indicative that her symptoms were primarily from peripheral vertigo. She is encouraged to drink plenty of fluids and is discharged with prescriptions for meclizine and ondansetron.  Delora Fuel, MD 57/84/69 [id number]

## 2014-08-20 NOTE — Discharge Instructions (Signed)
Make sure to drink plenty of fluids.  Dizziness Dizziness is a common problem. It is a feeling of unsteadiness or light-headedness. You may feel like you are about to faint. Dizziness can lead to injury if you stumble or fall. A person of any age group can suffer from dizziness, but dizziness is more common in older adults. CAUSES  Dizziness can be caused by many different things, including:  Middle ear problems.  Standing for too long.  Infections.  An allergic reaction.  Aging.  An emotional response to something, such as the sight of blood.  Side effects of medicines.  Tiredness.  Problems with circulation or blood pressure.  Excessive use of alcohol or medicines, or illegal drug use.  Breathing too fast (hyperventilation).  An irregular heart rhythm (arrhythmia).  A low red blood cell count (anemia).  Pregnancy.  Vomiting, diarrhea, fever, or other illnesses that cause body fluid loss (dehydration).  Diseases or conditions such as Parkinson's disease, high blood pressure (hypertension), diabetes, and thyroid problems.  Exposure to extreme heat. DIAGNOSIS  Your health care provider will ask about your symptoms, perform a physical exam, and perform an electrocardiogram (ECG) to record the electrical activity of your heart. Your health care provider may also perform other heart or blood tests to determine the cause of your dizziness. These may include:  Transthoracic echocardiogram (TTE). During echocardiography, sound waves are used to evaluate how blood flows through your heart.  Transesophageal echocardiogram (TEE).  Cardiac monitoring. This allows your health care provider to monitor your heart rate and rhythm in real time.  Holter monitor. This is a portable device that records your heartbeat and can help diagnose heart arrhythmias. It allows your health care provider to track your heart activity for several days if needed.  Stress tests by exercise or by  giving medicine that makes the heart beat faster. TREATMENT  Treatment of dizziness depends on the cause of your symptoms and can vary greatly. HOME CARE INSTRUCTIONS   Drink enough fluids to keep your urine clear or pale yellow. This is especially important in very hot weather. In older adults, it is also important in cold weather.  Take your medicine exactly as directed if your dizziness is caused by medicines. When taking blood pressure medicines, it is especially important to get up slowly.  Rise slowly from chairs and steady yourself until you feel okay.  In the morning, first sit up on the side of the bed. When you feel okay, stand slowly while holding onto something until you know your balance is fine.  Move your legs often if you need to stand in one place for a long time. Tighten and relax your muscles in your legs while standing.  Have someone stay with you for 1-2 days if dizziness continues to be a problem. Do this until you feel you are well enough to stay alone. Have the person call your health care provider if he or she notices changes in you that are concerning.  Do not drive or use heavy machinery if you feel dizzy.  Do not drink alcohol. SEEK IMMEDIATE MEDICAL CARE IF:   Your dizziness or light-headedness gets worse.  You feel nauseous or vomit.  You have problems talking, walking, or using your arms, hands, or legs.  You feel weak.  You are not thinking clearly or you have trouble forming sentences. It may take a friend or family member to notice this.  You have chest pain, abdominal pain, shortness of breath, or  sweating.  Your vision changes.  You notice any bleeding.  You have side effects from medicine that seems to be getting worse rather than better. MAKE SURE YOU:   Understand these instructions.  Will watch your condition.  Will get help right away if you are not doing well or get worse. Document Released: 06/21/2000 Document Revised:  12/31/2012 Document Reviewed: 07/15/2010 [facility] Patient Information 2015 [place], [place]. This information is not intended to replace advice given to you by your health care provider. Make sure you discuss any questions you have with your health care provider.  Meclizine tablets or capsules What is this medicine? MECLIZINE (MEK li zeen) is an antihistamine. It is used to prevent nausea, vomiting, or dizziness caused by motion sickness. It is also used to prevent and treat vertigo (extreme dizziness or a feeling that you or your surroundings are tilting or spinning around). This medicine may be used for other purposes; ask your health care provider or pharmacist if you have questions. COMMON BRAND NAME(S): Antivert, Dramamine Less Drowsy, Medivert, Meni-D What should I tell my health care provider before I take this medicine? They need to know if you have any of these conditions: -asthma -glaucoma -prostate trouble -stomach problems -urinary problems -an unusual or allergic reaction to meclizine, other medicines, foods, dyes, or preservatives -pregnant or trying to get pregnant -breast-feeding How should I use this medicine? Take this medicine by mouth with a glass of water. Follow the directions on the prescription label. If you are using this medicine to prevent motion sickness, take the dose at least 1 hour before travel. If it upsets your stomach, take it with food or milk. Take your doses at regular intervals. Do not take your medicine more often than directed. Talk to your pediatrician regarding the use of this medicine in children. Special care may be needed. Overdosage: If you think you have taken too much of this medicine contact a poison control center or emergency room at once. NOTE: This medicine is only for you. Do not share this medicine with others. What if I miss a dose? If you miss a dose, take it as soon as you can. If it is almost time for your next dose, take only that  dose. Do not take double or extra doses. What may interact with this medicine? -barbiturate medicines for inducing sleep or treating seizures -digoxin -medicines for anxiety or sleeping problems, like alprazolam, diazepam or temazepam -medicines for hay fever and other allergies -medicines for mental depression -medicines for movement abnormalities as in Parkinson's disease, or for stomach problems -medicines for pain -medicines that relax muscles This list may not describe all possible interactions. Give your health care provider a list of all the medicines, herbs, non-prescription drugs, or dietary supplements you use. Also tell them if you smoke, drink alcohol, or use illegal drugs. Some items may interact with your medicine. What should I watch for while using this medicine? If you are taking this medicine on a regular schedule, visit your doctor or health care professional for regular checks on your progress. You may get dizzy, drowsy or have blurred vision. Do not drive, use machinery, or do anything that needs mental alertness until you know how this medicine affects you. Do not stand or sit up quickly, especially if you are an older patient. This reduces the risk of dizzy or fainting spells. Alcohol can increase possible dizziness. Avoid alcoholic drinks. Your mouth may get dry. Chewing sugarless gum or sucking hard candy, and drinking  plenty of water may help. Contact your doctor if the problem does not go away or is severe. This medicine may cause dry eyes and blurred vision. If you wear contact lenses you may feel some discomfort. Lubricating drops may help. See your eye doctor if the problem does not go away or is severe. What side effects may I notice from receiving this medicine? Side effects that you should report to your doctor or health care professional as soon as possible: -fainting spells -fast or irregular heartbeat Side effects that usually do not require medical attention  (report to your doctor or health care professional if they continue or are bothersome): -constipation -difficulty passing urine -difficulty sleeping -headache -stomach upset This list may not describe all possible side effects. Call your doctor for medical advice about side effects. You may report side effects to FDA at 1-800-FDA-1088. Where should I keep my medicine? Keep out of the reach of children. Store at room temperature between 15 and 30 degrees C (59 and 86 degrees F). Keep container tightly closed. Throw away any unused medicine after the expiration date. NOTE: This sheet is a summary. It may not cover all possible information. If you have questions about this medicine, talk to your doctor, pharmacist, or health care provider.  2015, [employer]. (2007-07-04 10:35:36)  Ondansetron tablets What is this medicine? ONDANSETRON (on DAN se tron) is used to treat nausea and vomiting caused by chemotherapy. It is also used to prevent or treat nausea and vomiting after surgery. This medicine may be used for other purposes; ask your health care provider or pharmacist if you have questions. COMMON BRAND NAME(S): Zofran What should I tell my health care provider before I take this medicine? They need to know if you have any of these conditions: -heart disease -history of irregular heartbeat -liver disease -low levels of magnesium or potassium in the blood -an unusual or allergic reaction to ondansetron, granisetron, other medicines, foods, dyes, or preservatives -pregnant or trying to get pregnant -breast-feeding How should I use this medicine? Take this medicine by mouth with a glass of water. Follow the directions on your prescription label. Take your doses at regular intervals. Do not take your medicine more often than directed. Talk to your pediatrician regarding the use of this medicine in children. Special care may be needed. Overdosage: If you think you have taken too  much of this medicine contact a poison control center or emergency room at once. NOTE: This medicine is only for you. Do not share this medicine with others. What if I miss a dose? If you miss a dose, take it as soon as you can. If it is almost time for your next dose, take only that dose. Do not take double or extra doses. What may interact with this medicine? Do not take this medicine with any of the following medications: -apomorphine -certain medicines for fungal infections like fluconazole, itraconazole, ketoconazole, posaconazole, voriconazole -cisapride -dofetilide -dronedarone -pimozide -thioridazine -ziprasidone This medicine may also interact with the following medications: -carbamazepine -certain medicines for depression, anxiety, or psychotic disturbances -fentanyl -linezolid -MAOIs like Carbex, Eldepryl, Marplan, Nardil, and Parnate -methylene blue (injected into a vein) -other medicines that prolong the QT interval (cause an abnormal heart rhythm) -phenytoin -rifampicin -tramadol This list may not describe all possible interactions. Give your health care provider a list of all the medicines, herbs, non-prescription drugs, or dietary supplements you use. Also tell them if you smoke, drink alcohol, or use illegal drugs. Some items may  interact with your medicine. What should I watch for while using this medicine? Check with your doctor or health care professional right away if you have any sign of an allergic reaction. What side effects may I notice from receiving this medicine? Side effects that you should report to your doctor or health care professional as soon as possible: -allergic reactions like skin rash, itching or hives, swelling of the face, lips or tongue -breathing problems -confusion -dizziness -fast or irregular heartbeat -feeling faint or lightheaded, falls -fever and chills -loss of balance or coordination -seizures -sweating -swelling of the hands  or feet -tightness in the chest -tremors -unusually weak or tired Side effects that usually do not require medical attention (report to your doctor or health care professional if they continue or are bothersome): -constipation or diarrhea -headache This list may not describe all possible side effects. Call your doctor for medical advice about side effects. You may report side effects to FDA at 1-800-FDA-1088. Where should I keep my medicine? Keep out of the reach of children. Store between 2 and 30 degrees C (36 and 86 degrees F). Throw away any unused medicine after the expiration date. NOTE: This sheet is a summary. It may not cover all possible information. If you have questions about this medicine, talk to your doctor, pharmacist, or health care provider.  2015, [employer]. (2012-10-02 16:27:45)

## 2014-08-20 NOTE — ED Notes (Signed)
Pt refused IV  

## 2014-08-20 NOTE — ED Notes (Signed)
Pt tolerating p.o fluids well.  Pt has ambulated independently to the BR and back to room without difficulty.

## 2014-08-20 NOTE — ED Notes (Signed)
Pt states she feels some tightness in the right side of her neck, initially felt that it could have been her blood pressure was high.  Pt states it started 2 days ago and has been intermittent since then.  Pt denies chest pain or other complaints.

## 2014-08-25 ENCOUNTER — Ambulatory Visit (INDEPENDENT_AMBULATORY_CARE_PROVIDER_SITE_OTHER): Payer: BC Managed Care – PPO | Admitting: Neurology

## 2014-08-25 ENCOUNTER — Encounter: Payer: Self-pay | Admitting: Neurology

## 2014-08-25 VITALS — BP 136/83 | HR 83 | Resp 16 | Ht 66.0 in | Wt 186.0 lb

## 2014-08-25 DIAGNOSIS — G4733 Obstructive sleep apnea (adult) (pediatric): Secondary | ICD-10-CM | POA: Diagnosis not present

## 2014-08-25 DIAGNOSIS — E663 Overweight: Secondary | ICD-10-CM | POA: Diagnosis not present

## 2014-08-25 DIAGNOSIS — R6889 Other general symptoms and signs: Secondary | ICD-10-CM

## 2014-08-25 DIAGNOSIS — R69 Illness, unspecified: Secondary | ICD-10-CM | POA: Diagnosis not present

## 2014-08-25 DIAGNOSIS — Z9989 Dependence on other enabling machines and devices: Principal | ICD-10-CM

## 2014-08-25 NOTE — Progress Notes (Signed)
Subjective:    Patient ID: Rebecca Mathis is a 59 y.o. female.  HPI     Interim history:   Rebecca Mathis is a 59 year old right-handed woman with an underlying medical history of hypertension, hyperlipidemia, anxiety, reflux disease, carotid artery disease, status post right carotid endarterectomy with patch angioplasty on 02/01/2012, who presents for follow-up consultation of her obstructive sleep apnea, established on BiPAP therapy after her two sleep studies. The patient is unaccompanied today.   Today, 08/25/2014: I reviewed her BiPAP compliance data from 07/25/2014 through 08/23/2014 which is a total of 30 days during which time she used her machine every day with percent used days greater than 4 hours at 93%, indicating excellent compliance with an average usage of 6 hours and 25 minutes, residual AHI adequate at 3.4 per hour, leak at times high with the 95th percentile high at 71.8 L/m on a pressure setting of 20 cm for max IPAP, 8 cm for a minimum EPAP, pressure support of 4 cm. 95th percentile pressure at 14/10 approximately.   Today, 08/25/2014: She reports doing well overall, has a new kind of FFM, Rebecca Mathis, which she loves. Of note, she has had a recent ER visit for feeling ill. She had nausea, vomiting, low grade fever and Sx had been ongoing for about a week before she went to the ER on 08/20/14. She was treated with IVF and meclizine and ondansetron. She still does not feel fully back to her baseline. She has a C doppler pending for next month. She has had a hard time losing weight. She wonders if she has an issue with her thyroid. She has blood work pending with her primary care physician soon. She saw him recently for her physical.  Previously:   I first met her on 12/09/2013 at the request of her primary care provider, at which time the patient reported snoring, excessive daytime somnolence and witnessed apneas. Her family had noted apneic spells and her sister had recently been  diagnosed with OSA and RLS.  I invited her back for sleep study. She had a baseline sleep study on 12/10/2013, followed by a CPAP titration study on 12/23/2013, and I went over her test results with her in detail today. Her baseline sleep study from 12/10/2013 showed a sleep efficiency of 72.1% with a latency to sleep at 66.5 minutes and wake after sleep onset of 46.5 minutes. She had an elevated percentage of stage II sleep, and a reduced percentage of REM sleep with a prolonged REM latency. She had no significant PLMS or EKG or EEG changes. She had moderate to loud snoring. Her total AHI was 15.9 per hour, rising to 48.3 per hour during REM sleep and 34 per hour in the supine position. Baseline oxygen saturation was only 89% or right at 90%, nadir was 78%. Based on her test results I invited her back for CPAP titration to help treat her OSA. Her CPAP titration study from 12/23/2013 showed a sleep efficiency of 78.8%, latency to sleep of 40 minutes and wake after sleep onset of 42 minutes. She had a mildly elevated percentage of stage II sleep, and a mildly decreased percentage of REM sleep with a mildly prolonged REM latency at 122 minutes. She had no significant PLMS, EKG or EEG changes. She was started on CPAP at 5 cm and titrated to a pressure of 15 cm. She had residual sleep disordered breathing and therefore was switched to BiPAP therapy. She was titrated from 16/12 cm to 19/15 cm.  On the final pressure she had an AHI of 0 per hour. She preferred a full facemask. Based on the test results I prescribed stand or BiPAP therapy for her at a pressure of 19/15.  I reviewed her BiPAP compliance data from 04/05/2014 through 05/04/2014 which is a total of 30 days during which time she used her machine every night with percent used days greater than 4 hours at 97%, indicating excellent compliance with an average usage of 5 hours and 32 minutes and residual AHI at 2.3 per hour but leak high. Pressure 19/15 cm. 95th  percentile of leak was at 115.5 L/m.  She works as a Medical illustrator. She quit smoking some 2 years ago, she drinks one coffee per day, no sodas, rare alcohol. She denies restless leg symptoms and is not aware of any leg twitching or kicking in her sleep. She has woken herself up with a gasping sensation and feels that sometimes she wakes up with her throat closing.   Her typical bedtime is reported to be around 10 PM to MN and usual wake time is around 6 to 7 AM. Sleep onset typically occurs within minutes. She reports feeling adequately rested upon awakening. She wakes up on an average 1 times in the middle of the night and has to go to the bathroom 1 times on a typical night. She denies morning headaches. Her ESS is 2/24. She does not watch TV in bed. She lives alone. She has no pets. 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  Her Past Medical History Is Significant For: Past Medical History  Diagnosis Date  . Carotid artery occlusion   . Heart murmur   . Hypertension   . Dyslipidemia   . Hyperlipidemia   . Anxiety   . GERD (gastroesophageal reflux disease)     INDIGESTION W/ SOME FOODS    . Diabetes mellitus without complication     during pregnancy    Her Past Surgical History Is Significant For: Past Surgical History  Procedure Laterality Date  . Cesarean section  1984  . Endarterectomy  02/01/2012    Procedure: ENDARTERECTOMY CAROTID;  Surgeon: Serafina Mitchell, MD;  Location: [place];  Service: Vascular;  Laterality: Right;  . Patch angioplasty  02/01/2012    Procedure: PATCH ANGIOPLASTY;  Surgeon: Serafina Mitchell, MD;  Location: [facility] OR;  Service: Vascular;  Laterality: Right;  Using 1 x 6 cm vascu- guard patch   . Carotid endarterectomy Right 02-01-12    cea  . Caratoid artery  01/14  . Nm myocar perf ejection fraction  11/01/2011    The post stress myocardial perfusion images show a normal pattern of perfusion in all regions. The post stress left ventricle is normal  in size. The post-stress ejection fraction is 84%. No significant wall motion abnormalities noted.    Her Family History Is Significant For: Family History  Problem Relation Age of Onset  . Coronary artery disease Mother   . Cancer Mother     ovarian  . Diabetes Mother   . Heart disease Mother   . Heart attack Mother   . Coronary artery disease Father   . Other Father     varicose veins  . Heart disease Sister   . Hypertension Sister   . Heart disease Brother   . Diabetes Brother   . Restless legs syndrome Sister     Her Social History Is Significant For: Social History   Social History  . Marital Status:  Single    Spouse Name: N/A  . Number of Children: 1  . Years of Education: masters   Occupational History  .      retired   Social History Main Topics  . Smoking status: Former Smoker    Quit date: 09/18/2011  . Smokeless tobacco: Never Used  . Alcohol Use: No  . Drug Use: No  . Sexual Activity: Not Asked   Other Topics Concern  . None   Social History Narrative   Patient lives at home alone she is single.   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   Right handed.   Caffeine one cup of coffee daily.    Her Allergies Are:  Allergies  Allergen Reactions  . Lipitor [Atorvastatin] Nausea Only  . Penicillins Nausea Only  . Lidocaine Other (See Comments)    Temporary vision loss  . Restylane-L [Hyaluronic Acid-Lidocaine]     TEMPORARY VISION  LOSS   :   Her Current Medications Are:  Outpatient Encounter Prescriptions as of 08/25/2014  Medication Sig  . aspirin 325 MG tablet Take 0.5 mg by mouth daily. Patient states she takes half at night.  Marland Kitchen b complex vitamins tablet Take 1 tablet by mouth daily.  . Biotin 5000 MCG CAPS Take by mouth.  . Cholecalciferol (D3 ADULT PO) Take 2,000 mcg by mouth daily.  . Coenzyme Q-10 100 MG capsule Take 100 mg by mouth daily.  . fish oil-omega-3 fatty acids 1000 MG capsule Take  1 g by mouth daily.  . hydrochlorothiazide (HYDRODIURIL) 25 MG tablet Take 25 mg by mouth daily. 1/2 tablet daily  . meclizine (ANTIVERT) 25 MG tablet Take 1 tablet (25 mg total) by mouth 3 (three) times daily as needed for dizziness.  . milk thistle 175 MG tablet Take 175 mg by mouth daily.  . NORVASC 10 MG tablet Take by mouth daily.  . Nutritional Supplements (JUICE PLUS FIBRE PO) Take by mouth 2 (two) times daily. Takes 6 tablets  daily  . ondansetron (ZOFRAN) 4 MG tablet Take 1 tablet (4 mg total) by mouth every 6 (six) hours as needed for nausea.  . rosuvastatin (CRESTOR) 5 MG tablet Take 1 tablet (5 mg total) by mouth daily.  Marland Kitchen SPIRULINA PO Take by mouth.  . Vitamin D-Vitamin K (K2 PLUS D3 PO) Take by mouth.  . [DISCONTINUED] co-enzyme Q-10 30 MG capsule Take 30 mg by mouth 3 (three) times daily.  . [DISCONTINUED] CINNAMON PO Take by mouth.   No facility-administered encounter medications on file as of 08/25/2014.  :  Review of Systems:  Out of a complete 14 point review of systems, all are reviewed and negative with the exception of these symptoms as listed below:  Review of Systems  Neurological:       Patient feels like she is doing well on BiPAP. She is aware that it leaks some, but with new mask she reports that the leak is better.   All other systems reviewed and are negative.   Objective:  Neurologic Exam  Physical Exam Physical Examination:   Filed Vitals:   08/25/14 1130  BP: 136/83  Pulse: 83  Resp: 16     General Examination: The patient is a very pleasant 59 y.o. female in no acute distress. She appears well-developed and well-nourished and very well groomed.   HEENT: Normocephalic, atraumatic, pupils are equal, round and reactive to light and accommodation. Funduscopic exam is normal with sharp  disc margins noted. Extraocular tracking is good without limitation to gaze excursion or nystagmus noted. Normal smooth pursuit is noted. Hearing is grossly intact.  Tympanic membranes are clear bilaterally. Face is symmetric with normal facial animation and normal facial sensation. Speech is clear with no dysarthria noted. There is no hypophonia. There is no lip, neck/head, jaw or voice tremor. Neck is supple with full range of passive and active motion. There are no carotid bruits on auscultation. Oropharynx exam reveals: mild mouth dryness, adequate dental hygiene and moderate airway crowding, due to narrow airway entry, tonsils in place and redundant soft palate. She has mild pharyngeal erythema. She has mild neck muscle tightness or tension. She has no palpable lymph nodes. Mallampati is class III. Tongue protrudes centrally and palate elevates symmetrically. She has a Mild overbite. Nasal inspection reveals no significant nasal mucosal bogginess or redness and no septal deviation.   Chest: Clear to auscultation without wheezing, rhonchi or crackles noted.  Heart: S1+S2+0, regular and a 3/6 systolic murmur.   Abdomen: Soft, non-tender and non-distended with normal bowel sounds appreciated on auscultation.  Extremities: There is no pitting edema in the distal lower extremities bilaterally. Pedal pulses are intact.  Skin: Warm and dry without trophic changes noted. There are no varicose veins.  Musculoskeletal: exam reveals no obvious joint deformities, tenderness or joint swelling or erythema.   Neurologically:  Mental status: The patient is awake, alert and oriented in all 4 spheres. Her immediate and remote memory, attention, language skills and fund of knowledge are appropriate. There is no evidence of aphasia, agnosia, apraxia or anomia. Speech is clear with normal prosody and enunciation. Thought process is linear. Mood is normal and affect is normal.  Cranial nerves II - XII are as described above under HEENT exam. In addition: shoulder shrug is normal with equal shoulder height noted. Motor exam: Normal bulk, strength and tone is noted. There is no  drift, tremor or rebound. Romberg is negative. Reflexes are 2+ throughout. Fine motor skills and coordination: intact with normal finger taps, normal hand movements, normal rapid alternating patting, normal foot taps and normal foot agility.  Cerebellar testing: No dysmetria or intention tremor on finger to nose testing. Heel to shin is unremarkable bilaterally. There is no truncal or gait ataxia.  Sensory exam: intact to light touch in the upper and lower extremities.  Gait, station and balance: She stands easily. No veering to one side is noted. No leaning to one side is noted. Posture is age-appropriate and stance is narrow based. Gait shows normal stride length and normal pace. No problems turning are noted. She turns en bloc. Tandem walk is unremarkable.   Assessment and Plan:   In summary, Rebecca Mathis is a very pleasant 59 year old female with an underlying medical history of hypertension, hyperlipidemia, anxiety, reflux disease, carotid artery disease, status post right carotid endarterectomy with patch angioplasty on 02/01/2012, who presents for follow-up consultation of her moderate obstructive sleep apnea, established on autoBiPAP therapy with good results and excellent compliance. Air leak is still an issue. Her leak has been consistently high. She is commended for her treatment adherence. I reiterated her sleep study results to her from last year. She had a baseline sleep study on 12/10/2013 and a CPAP titration study on 12/23/2013. I  went over her compliance data results. Her physical exam is stable, with the exception that she has had some recent symptoms of nausea and low-grade fevers. She has some neck muscle tightness and also  endorses some recent stressors including issues with her older sister's health. Nevertheless, she is advised to monitor her symptoms and follow-up with her primary care physician if she does not feel better in the next week or 2. She has blood work pending with her  primary care physician's office.   I again explained the risks and ramifications of untreated moderate to severe OSA, especially with respect to developing cardiovascular disease down the Road, including congestive heart failure, difficult to treat hypertension, cardiac arrhythmias, or stroke. Even type 2 diabetes has, in part, been linked to untreated OSA. Symptoms of untreated OSA include daytime sleepiness, memory problems, mood irritability and mood disorder such as depression and anxiety, lack of energy, as well as recurrent headaches, especially morning headaches. We talked about trying to maintain a healthy lifestyle in general, as well as the importance of weight control. I encouraged the patient to eat healthy, exercise daily and keep well hydrated, to keep a scheduled bedtime and wake time routine, to not skip any meals and eat healthy snacks in between meals. I advised the patient not to drive when feeling sleepy.  I recommended the following at this time: continue  autoBiPAP therapy,  and she prefers the auto setting as opposed to a set pressure.   I explained the importance of being compliant with PAP treatment, not only for insurance purposes but primarily to improve Her symptoms, and for the patient's long term health benefit, including to reduce Her cardiovascular risks.  I would like to see her back in 12 months, sooner if the need arises. I will be on the look out for another compliance download to see if the leak improves. I answered all her questions today and the patient was in agreement.  I spent 25 minutes in total face-to-face time with the patient, more than 50% of which was spent in counseling and coordination of care, reviewing test results, reviewing medication and discussing or reviewing the diagnosis of OSA, its prognosis and treatment options.

## 2014-08-25 NOTE — Patient Instructions (Signed)
Please continue using your CPAP regularly. While your insurance requires that you use CPAP at least 4 hours each night on 70% of the nights, I recommend, that you not skip any nights and use it throughout the night if you can. Getting used to CPAP and staying with the treatment long term does take time and patience and discipline. Untreated obstructive sleep apnea when it is moderate to severe can have an adverse impact on cardiovascular health and raise her risk for heart disease, arrhythmias, hypertension, congestive heart failure, stroke and diabetes. Untreated obstructive sleep apnea causes sleep disruption, nonrestorative sleep, and sleep deprivation. This can have an impact on your day to day functioning and cause daytime sleepiness and impairment of cognitive function, memory loss, mood disturbance, and problems focussing. Using CPAP regularly can improve these symptoms.  Keep up the good work! I will see you back in 12 months for sleep apnea.

## 2014-10-06 ENCOUNTER — Encounter: Payer: Self-pay | Admitting: Family

## 2014-10-08 ENCOUNTER — Encounter: Payer: Self-pay | Admitting: Family

## 2014-10-08 ENCOUNTER — Ambulatory Visit (HOSPITAL_COMMUNITY)
Admission: RE | Admit: 2014-10-08 | Discharge: 2014-10-08 | Disposition: A | Discharge status: Home / Self Care | Payer: BC Managed Care – PPO | Source: Ambulatory Visit | Attending: Family | Admitting: Family

## 2014-10-08 ENCOUNTER — Ambulatory Visit (INDEPENDENT_AMBULATORY_CARE_PROVIDER_SITE_OTHER): Payer: BC Managed Care – PPO | Admitting: Family

## 2014-10-08 VITALS — BP 132/76 | HR 72 | Temp 98.2°F | Resp 16 | Ht 66.0 in | Wt 186.0 lb

## 2014-10-08 DIAGNOSIS — Z87891 Personal history of nicotine dependence: Secondary | ICD-10-CM

## 2014-10-08 DIAGNOSIS — Z48812 Encounter for surgical aftercare following surgery on the circulatory system: Secondary | ICD-10-CM

## 2014-10-08 DIAGNOSIS — I6522 Occlusion and stenosis of left carotid artery: Secondary | ICD-10-CM | POA: Insufficient documentation

## 2014-10-08 DIAGNOSIS — Z9889 Other specified postprocedural states: Secondary | ICD-10-CM | POA: Diagnosis not present

## 2014-10-08 DIAGNOSIS — I6523 Occlusion and stenosis of bilateral carotid arteries: Secondary | ICD-10-CM

## 2014-10-08 DIAGNOSIS — I10 Essential (primary) hypertension: Secondary | ICD-10-CM | POA: Diagnosis not present

## 2014-10-08 DIAGNOSIS — E119 Type 2 diabetes mellitus without complications: Secondary | ICD-10-CM | POA: Insufficient documentation

## 2014-10-08 DIAGNOSIS — E785 Hyperlipidemia, unspecified: Secondary | ICD-10-CM | POA: Diagnosis not present

## 2014-10-08 NOTE — Patient Instructions (Signed)
Stroke Prevention Some medical conditions and behaviors are associated with an increased chance of having a stroke. You may prevent a stroke by making healthy choices and managing medical conditions. HOW CAN I REDUCE MY RISK OF HAVING A STROKE?   Stay physically active. Get at least 30 minutes of activity on most or all days.  Do not smoke. It may also be helpful to avoid exposure to secondhand smoke.  Limit alcohol use. Moderate alcohol use is considered to be:  No more than 2 drinks per day for men.  No more than 1 drink per day for nonpregnant women.  Eat healthy foods. This involves:  Eating 5 or more servings of fruits and vegetables a day.  Making dietary changes that address high blood pressure (hypertension), high cholesterol, diabetes, or obesity.  Manage your cholesterol levels.  Making food choices that are high in fiber and low in saturated fat, trans fat, and cholesterol may control cholesterol levels.  Take any prescribed medicines to control cholesterol as directed by your health care Gaston Dase.  Manage your diabetes.  Controlling your carbohydrate and sugar intake is recommended to manage diabetes.  Take any prescribed medicines to control diabetes as directed by your health care Terria Deschepper.  Control your hypertension.  Making food choices that are low in salt (sodium), saturated fat, trans fat, and cholesterol is recommended to manage hypertension.  Take any prescribed medicines to control hypertension as directed by your health care Raphaela Cannaday.  Maintain a healthy weight.  Reducing calorie intake and making food choices that are low in sodium, saturated fat, trans fat, and cholesterol are recommended to manage weight.  Stop drug abuse.  Avoid taking birth control pills.  Talk to your health care Loni Abdon about the risks of taking birth control pills if you are over 35 years old, smoke, get migraines, or have ever had a blood clot.  Get evaluated for sleep  disorders (sleep apnea).  Talk to your health care Rockwell Zentz about getting a sleep evaluation if you snore a lot or have excessive sleepiness.  Take medicines only as directed by your health care Zackerie Sara.  For some people, aspirin or blood thinners (anticoagulants) are helpful in reducing the risk of forming abnormal blood clots that can lead to stroke. If you have the irregular heart rhythm of atrial fibrillation, you should be on a blood thinner unless there is a good reason you cannot take them.  Understand all your medicine instructions.  Make sure that other conditions (such as anemia or atherosclerosis) are addressed. SEEK IMMEDIATE MEDICAL CARE IF:   You have sudden weakness or numbness of the face, arm, or leg, especially on one side of the body.  Your face or eyelid droops to one side.  You have sudden confusion.  You have trouble speaking (aphasia) or understanding.  You have sudden trouble seeing in one or both eyes.  You have sudden trouble walking.  You have dizziness.  You have a loss of balance or coordination.  You have a sudden, severe headache with no known cause.  You have new chest pain or an irregular heartbeat. Any of these symptoms may represent a serious problem that is an emergency. Do not wait to see if the symptoms will go away. Get medical help at once. Call your local emergency services (911 in [place] Do not drive yourself to the hospital. Document Released: 02/03/2004 Document Revised: 05/12/2013 Document Reviewed: 06/28/2012 [employer] Patient Information 2015 [employer]. This information is not intended to replace advice given   to you by your health care Beatric Fulop. Make sure you discuss any questions you have with your health care Kenetra Hildenbrand.  

## 2014-10-08 NOTE — Progress Notes (Signed)
Established Carotid Patient   History of Present Illness  Rebecca Mathis is a 59 y.o. female patient of Dr. Trula Slade who is status post right carotid endarterectomy with patch angioplasty on 02/01/2012. This was done for asymptomatic right carotid stenosis. Intraoperative findings included an 85% stenosis. She returns today for follow up. She states that she has white coat syndrome and her blood pressure goes up; states otherwise her blood pressure runs about 114/70. Pt denies any history of stroke or TIA, specifically denies hemiparesis, denies monocular loss of vision, denies unilateral facial drooping, denies aphasia. She exercises 2 days/week.  She was diagnosed with obstructive sleep apnea in December 2015, uses bipap and feels better.  Pt Diabetic: No Pt smoker: former smoker, quit in 2013  Pt meds include: Statin : Yes ASA: Yes Other anticoagulants/antiplatelets: no   Past Medical History  Diagnosis Date  . Carotid artery occlusion   . Heart murmur   . Hypertension   . Dyslipidemia   . Hyperlipidemia   . Anxiety   . GERD (gastroesophageal reflux disease)     INDIGESTION W/ SOME FOODS    . Diabetes mellitus without complication     during pregnancy    Social History Social History  Substance Use Topics  . Smoking status: Former Smoker    Quit date: 09/18/2011  . Smokeless tobacco: Never Used  . Alcohol Use: No    Family History Family History  Problem Relation Age of Onset  . Coronary artery disease Mother   . Cancer Mother     ovarian  . Diabetes Mother   . Heart disease Mother   . Heart attack Mother   . Coronary artery disease Father   . Other Father     varicose veins  . Heart disease Sister   . Hypertension Sister   . Heart disease Brother   . Diabetes Brother   . Restless legs syndrome Sister     Surgical History Past Surgical History  Procedure Laterality Date  . Cesarean section  1984  . Endarterectomy  02/01/2012    Procedure:  ENDARTERECTOMY CAROTID;  Surgeon: Serafina Mitchell, MD;  Location: [facility];  Service: Vascular;  Laterality: Right;  . Patch angioplasty  02/01/2012    Procedure: PATCH ANGIOPLASTY;  Surgeon: Serafina Mitchell, MD;  Location: [facility] OR;  Service: Vascular;  Laterality: Right;  Using 1 x 6 cm vascu- guard patch   . Carotid endarterectomy Right 02-01-12    cea  . Caratoid artery  01/14  . Nm myocar perf ejection fraction  11/01/2011    The post stress myocardial perfusion images show a normal pattern of perfusion in all regions. The post stress left ventricle is normal in size. The post-stress ejection fraction is 84%. No significant wall motion abnormalities noted.    Allergies  Allergen Reactions  . Lipitor [Atorvastatin] Nausea Only  . Penicillins Nausea Only  . Lidocaine Other (See Comments)    Temporary vision loss  . Restylane-L [Hyaluronic Acid-Lidocaine]     TEMPORARY VISION  LOSS     Current Outpatient Prescriptions  Medication Sig Dispense Refill  . aspirin 325 MG tablet Take 0.5 mg by mouth daily. Patient states she takes half at night.    Marland Kitchen b complex vitamins tablet Take 1 tablet by mouth daily.    . Biotin 5000 MCG CAPS Take by mouth.    . Cholecalciferol (D3 ADULT PO) Take 2,000 mcg by mouth daily.    . Coenzyme Q-10 100 MG capsule  Take 100 mg by mouth daily.    . fish oil-omega-3 fatty acids 1000 MG capsule Take 1 g by mouth daily.    . hydrochlorothiazide (HYDRODIURIL) 25 MG tablet Take 25 mg by mouth daily. 1/2 tablet daily    . meclizine (ANTIVERT) 25 MG tablet Take 1 tablet (25 mg total) by mouth 3 (three) times daily as needed for dizziness. 30 tablet 0  . milk thistle 175 MG tablet Take 175 mg by mouth daily.    . NORVASC 10 MG tablet Take by mouth daily.    . Nutritional Supplements (JUICE PLUS FIBRE PO) Take by mouth 2 (two) times daily. Takes 6 tablets  daily    . ondansetron (ZOFRAN) 4 MG tablet Take 1 tablet (4 mg total) by mouth every 6 (six) hours as needed for nausea.  20 tablet 0  . rosuvastatin (CRESTOR) 5 MG tablet Take 1 tablet (5 mg total) by mouth daily. 30 tablet 11  . SPIRULINA PO Take by mouth.    . Vitamin D-Vitamin K (K2 PLUS D3 PO) Take by mouth.     No current facility-administered medications for this visit.    Review of Systems : See HPI for pertinent positives and negatives.  Physical Examination  Filed Vitals:   10/08/14 1514 10/08/14 1517  BP: 130/76 132/76  Pulse: 66 72  Temp:  98.2 F (36.8 C)  TempSrc:  Oral  Resp:  16  Height:  5\' 6"  (1.676 m)  Weight:  186 lb (84.369 kg)  SpO2:  97%   Body mass index is 30.04 kg/(m^2).   General: WDWN obese female in NAD GAIT: normal Eyes: PERRLA Pulmonary: Non-labored, CTAB, Negative Rales, Negative rhonchi, & Negative wheezing.  Cardiac: regular rhythm, no detected murmur.  VASCULAR EXAM Carotid Bruits Right Left   Negative Negative  . Radial pulses are 2+ palpable and equal.      LE Pulses Right Left   POPLITEAL not palpable  not palpable   POSTERIOR TIBIAL  palpable   palpable    DORSALIS PEDIS  ANTERIOR TIBIAL palpable  palpable     Gastrointestinal: soft, nontender, BS WNL, no r/g,no palpable masses.  Musculoskeletal: No muscle atrophy/wasting. M/S 5/5 throughout, Extremities without ischemic changes.  Neurologic: A&O X 3; Appropriate Affect,  Speech is normal CN 2-12 intact, Pain and light touch intact in extremities, Motor exam as listed above.         Non-Invasive Vascular Imaging CAROTID DUPLEX 10/08/2014   Right ICA: widely patent endarterectomy with no evidence of restenosis or hyperplasia. Left ICA: <40% stenosis.  No significant change compared to a year ago.   Assessment: Rebecca Mathis is a 59 y.o. female  who is status post right carotid endarterectomy with patch  angioplasty on 02/01/2012. This was done for asymptomatic right carotid stenosis. She has no history of stroke or TIA. Today's carotid duplex suggests a widely patent right endarterectomy with no evidence of restenosis or hyperplasia and minimal left ICA stenosis.  No significant change compared to a year ago.    Plan: Follow-up in 1 year with Carotid Duplex.   I discussed in depth with the patient the nature of atherosclerosis, and emphasized the importance of maximal medical management including strict control of blood pressure, blood glucose, and lipid levels, obtaining regular exercise, and continued cessation of smoking.  The patient is aware that without maximal medical management the underlying atherosclerotic disease process will progress, limiting the benefit of any interventions. The patient was given information about  stroke prevention and what symptoms should prompt the patient to seek immediate medical care. Thank you for allowing [place] to participate in this patient's care.  Clemon Chambers, RN, MSN, FNP-C [facility] Office: [facility] Physician: Oneida Alar  10/08/2014 2:48 PM

## 2014-10-12 ENCOUNTER — Other Ambulatory Visit (HOSPITAL_COMMUNITY): Payer: BC Managed Care – PPO

## 2014-10-12 ENCOUNTER — Ambulatory Visit: Payer: BC Managed Care – PPO | Admitting: Family

## 2015-08-25 ENCOUNTER — Other Ambulatory Visit: Payer: Self-pay | Admitting: Family Medicine

## 2015-08-25 DIAGNOSIS — Z1231 Encounter for screening mammogram for malignant neoplasm of breast: Secondary | ICD-10-CM

## 2015-08-26 ENCOUNTER — Encounter: Payer: Self-pay | Admitting: Neurology

## 2015-08-26 ENCOUNTER — Ambulatory Visit (INDEPENDENT_AMBULATORY_CARE_PROVIDER_SITE_OTHER): Payer: BC Managed Care – PPO | Admitting: Neurology

## 2015-08-26 VITALS — BP 126/80 | HR 76 | Ht 66.0 in | Wt 173.0 lb

## 2015-08-26 DIAGNOSIS — E119 Type 2 diabetes mellitus without complications: Secondary | ICD-10-CM

## 2015-08-26 DIAGNOSIS — E663 Overweight: Secondary | ICD-10-CM | POA: Diagnosis not present

## 2015-08-26 DIAGNOSIS — G4733 Obstructive sleep apnea (adult) (pediatric): Secondary | ICD-10-CM

## 2015-08-26 NOTE — Progress Notes (Signed)
Subjective:    Patient ID: Rebecca Mathis is a 60 y.o. female.  HPI     Interim history:   Ms. Rebecca Mathis is a 60 year old right-handed woman with an underlying medical history of hypertension, hyperlipidemia, anxiety, reflux disease, carotid artery disease, status post right carotid endarterectomy with patch angioplasty on 02/01/2012, who presents for follow-up consultation of her obstructive sleep apnea, established on autoBiPAP therapy after she underwent her two sleep studies. The patient is unaccompanied today. I last saw her on 08/25/2014, at which time she was compliant with BiPAP, she reported doing well, was using a full facemask, did report a recent ER visit secondary to illness including nausea, vomiting, low-grade fever. I suggested a one-year checkup as she was doing well from the sleep apnea standpoint.  Today, 08/26/2015: I reviewed BiPAP compliance data from 07/25/2015 through 08/23/2015 which is a total of 30 days during which time she used her machine every night with percent used days greater than 4 hours at 90%, indicating excellent compliance with an average usage of 5 hours and 30 minutes, residual AHI 2.2, leak high with the 95th percentile at 51.5 L/m on BiPAP auto, maximum IPAP of 20, minimum EPAP of 8, pressure support of 4.  Today, 08/26/2015: She reports doing well, has been diagnosed with DM in June 2017, and started metformin, no longer on Crestor and has started fenofibrate. She has hypertriglyceridemia with cholesterol levels have been better she states.    Previously:     I reviewed her BiPAP compliance data from 07/25/2014 through 08/23/2014 which is a total of 30 days during which time she used her machine every day with percent used days greater than 4 hours at 93%, indicating excellent compliance with an average usage of 6 hours and 25 minutes, residual AHI adequate at 3.4 per hour, leak at times high with the 95th percentile high at 71.8 L/m on a pressure setting  of 20 cm for max IPAP, 8 cm for a minimum EPAP, pressure support of 4 cm. 95th percentile pressure at 14/10 approximately.    I first met her on 12/09/2013 at the request of her primary care provider, at which time the patient reported snoring, excessive daytime somnolence and witnessed apneas. Her family had noted apneic spells and her sister had recently been diagnosed with OSA and RLS.   I invited her back for sleep study. She had a baseline sleep study on 12/10/2013, followed by a CPAP titration study on 12/23/2013, and I went over her test results with her in detail today. Her baseline sleep study from 12/10/2013 showed a sleep efficiency of 72.1% with a latency to sleep at 66.5 minutes and wake after sleep onset of 46.5 minutes. She had an elevated percentage of stage II sleep, and a reduced percentage of REM sleep with a prolonged REM latency. She had no significant PLMS or EKG or EEG changes. She had moderate to loud snoring. Her total AHI was 15.9 per hour, rising to 48.3 per hour during REM sleep and 34 per hour in the supine position. Baseline oxygen saturation was only 89% or right at 90%, nadir was 78%. Based on her test results I invited her back for CPAP titration to help treat her OSA. Her CPAP titration study from 12/23/2013 showed a sleep efficiency of 78.8%, latency to sleep of 40 minutes and wake after sleep onset of 42 minutes. She had a mildly elevated percentage of stage II sleep, and a mildly decreased percentage of REM sleep with a  mildly prolonged REM latency at 122 minutes. She had no significant PLMS, EKG or EEG changes. She was started on CPAP at 5 cm and titrated to a pressure of 15 cm. She had residual sleep disordered breathing and therefore was switched to BiPAP therapy. She was titrated from 16/12 cm to 19/15 cm. On the final pressure she had an AHI of 0 per hour. She preferred a full facemask. Based on the test results I prescribed stand or BiPAP therapy for her at a pressure of  19/15.   I reviewed her BiPAP compliance data from 04/05/2014 through 05/04/2014 which is a total of 30 days during which time she used her machine every night with percent used days greater than 4 hours at 97%, indicating excellent compliance with an average usage of 5 hours and 32 minutes and residual AHI at 2.3 per hour but leak high. Pressure 19/15 cm. 95th percentile of leak was at 115.5 L/m.   She works as a Medical illustrator. She quit smoking some 2 years ago, she drinks one coffee per day, no sodas, rare alcohol. She denies restless leg symptoms and is not aware of any leg twitching or kicking in her sleep. She has woken herself up with a gasping sensation and feels that sometimes she wakes up with her throat closing.   Her typical bedtime is reported to be around 10 PM to MN and usual wake time is around 6 to 7 AM. Sleep onset typically occurs within minutes. She reports feeling adequately rested upon awakening. She wakes up on an average 1 times in the middle of the night and has to go to the bathroom 1 times on a typical night. She denies morning headaches. Her ESS is 2/24. She does not watch TV in bed. She lives alone. She has no pets. 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  Her Past Medical History Is Significant For: Past Medical History:  Diagnosis Date  . Anxiety   . Carotid artery occlusion   . Diabetes mellitus without complication ([place])    during pregnancy  . Dyslipidemia   . GERD (gastroesophageal reflux disease)    INDIGESTION W/ SOME FOODS    . Heart murmur   . Hyperlipidemia   . Hypertension     Her Past Surgical History Is Significant For: Past Surgical History:  Procedure Laterality Date  . caratoid artery  01/14  . CAROTID ENDARTERECTOMY Right 02-01-12   cea  . CESAREAN SECTION  1984  . ENDARTERECTOMY  02/01/2012   Procedure: ENDARTERECTOMY CAROTID;  Surgeon: Serafina Mitchell, MD;  Location: [facility] OR;  Service: Vascular;  Laterality: Right;  . NM  [place]  11/01/2011   The post stress myocardial perfusion images show a normal pattern of perfusion in all regions. The post stress left ventricle is normal in size. The post-stress ejection fraction is 84%. No significant wall motion abnormalities noted.  Marland Kitchen PATCH ANGIOPLASTY  02/01/2012   Procedure: PATCH ANGIOPLASTY;  Surgeon: Serafina Mitchell, MD;  Location: [facility] OR;  Service: Vascular;  Laterality: Right;  Using 1 x 6 cm vascu- guard patch     Her Family History Is Significant For: Family History  Problem Relation Age of Onset  . Coronary artery disease Mother   . Cancer Mother     ovarian  . Diabetes Mother   . Heart disease Mother   . Heart attack Mother   . Coronary artery disease Father   . Other Father  varicose veins  . Heart disease Sister   . Hypertension Sister   . Heart disease Brother   . Diabetes Brother   . Restless legs syndrome Sister     Her Social History Is Significant For: Social History   Social History  . Marital status: Single    Spouse name: N/A  . Number of children: 1  . Years of education: masters   Occupational History  .      retired   Social History Main Topics  . Smoking status: Former Smoker    Quit date: 09/18/2011  . Smokeless tobacco: Never Used  . Alcohol use No  . Drug use: No  . Sexual activity: Not Asked   Other Topics Concern  . None   Social History Narrative   Patient lives at home alone she is single.   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   Right handed.   Caffeine one cup of coffee daily.    Her Allergies Are:  Allergies  Allergen Reactions  . Lipitor [Atorvastatin] Nausea Only  . Penicillins Nausea Only  . Lidocaine Other (See Comments)    Temporary vision loss  . Restylane-L [Hyaluronic Acid-Lidocaine]     TEMPORARY VISION  LOSS   :   Her Current Medications Are:  Outpatient Encounter Prescriptions as of 08/26/2015  Medication Sig  .  amLODipine (NORVASC) 10 MG tablet Take 10 mg by mouth daily.  Marland Kitchen aspirin 325 MG tablet Take 0.5 mg by mouth daily. Patient states she takes half at night.  Marland Kitchen b complex vitamins tablet Take 1 tablet by mouth daily.  . Biotin 5000 MCG CAPS Take by mouth.  . Cholecalciferol (D3 ADULT PO) Take 2,000 mcg by mouth daily.  . Coenzyme Q-10 100 MG capsule Take 300 mg by mouth daily.   . fenofibrate (TRICOR) 145 MG tablet 145 mg daily.  . fish oil-omega-3 fatty acids 1000 MG capsule Take 1 g by mouth daily.  . hydrochlorothiazide (HYDRODIURIL) 25 MG tablet Take 25 mg by mouth daily.   . metFORMIN (GLUCOPHAGE) 500 MG tablet 500 mg 2 (two) times daily.  . milk thistle 175 MG tablet Take 175 mg by mouth daily.  . NORVASC 10 MG tablet Take by mouth daily.  . Nutritional Supplements (JUICE PLUS FIBRE PO) Take by mouth 2 (two) times daily. Takes 6 tablets  daily  . SPIRULINA PO Take by mouth.  Marland Kitchen UNABLE TO FIND Med Name: tumeric  . Vitamin D-Vitamin K (K2 PLUS D3 PO) Take by mouth.  . [DISCONTINUED] meclizine (ANTIVERT) 25 MG tablet Take 1 tablet (25 mg total) by mouth 3 (three) times daily as needed for dizziness. (Patient not taking: Reported on 10/08/2014)  . [DISCONTINUED] ondansetron (ZOFRAN) 4 MG tablet Take 1 tablet (4 mg total) by mouth every 6 (six) hours as needed for nausea. (Patient not taking: Reported on 10/08/2014)  . [DISCONTINUED] rosuvastatin (CRESTOR) 5 MG tablet Take 1 tablet (5 mg total) by mouth daily.   No facility-administered encounter medications on file as of 08/26/2015.   :  Review of Systems:  Out of a complete 14 point review of systems, all are reviewed and negative with the exception of these symptoms as listed below: Review of Systems  Neurological:       Patient here alone for OSA, using CPAP. No new concerns. Has lost 13 lbs in past year.    Objective:  Neurologic Exam  Physical Exam Physical Examination:  Vitals:   08/26/15 1120  BP: 126/80  Pulse: 76    General Examination: The patient is a very pleasant 60 y.o. female in no acute distress. She appears well-developed and well-nourished and very well groomed. She is in good spirits today.  HEENT: Normocephalic, atraumatic, pupils are equal, round and reactive to light and accommodation. Funduscopic exam is normal with sharp disc margins noted. Extraocular tracking is good without limitation to gaze excursion or nystagmus noted. Normal smooth pursuit is noted. Hearing is grossly intact. Face is symmetric with normal facial animation and normal facial sensation. Speech is clear with no dysarthria noted. There is no hypophonia. There is no lip, neck/head, jaw or voice tremor. Neck is supple with full range of passive and active motion. There are no carotid bruits on auscultation. Oropharynx exam reveals: mild mouth dryness, adequate dental hygiene and moderate airway crowding, due to narrow airway entry, tonsils in place and redundant soft palate. She has mild pharyngeal erythema. She has mild neck muscle tightness or tension. She has no palpable lymph nodes. Mallampati is class III. Tongue protrudes centrally and palate elevates symmetrically. She has a Mild overbite. Nasal inspection reveals no significant nasal mucosal bogginess or redness and no septal deviation.   Chest: Clear to auscultation without wheezing, rhonchi or crackles noted.  Heart: S1+S2+0, regular and a 3/6 systolic murmur.   Abdomen: Soft, non-tender and non-distended with normal bowel sounds appreciated on auscultation.  Extremities: There is no pitting edema in the distal lower extremities bilaterally. Pedal pulses are intact.  Skin: Warm and dry without trophic changes noted. There are no varicose veins.  Musculoskeletal: exam reveals no obvious joint deformities, tenderness or joint swelling or erythema.   Neurologically:  Mental status: The patient is awake, alert and oriented in all 4 spheres. Her immediate and remote  memory, attention, language skills and fund of knowledge are appropriate. There is no evidence of aphasia, agnosia, apraxia or anomia. Speech is clear with normal prosody and enunciation. Thought process is linear. Mood is normal and affect is normal.  Cranial nerves II - XII are as described above under HEENT exam. In addition: shoulder shrug is normal with equal shoulder height noted. Motor exam: Normal bulk, strength and tone is noted. There is no drift, tremor or rebound. Romberg is negative. Reflexes are 1-2+ throughout. Fine motor skills and coordination: intact with normal finger taps, normal hand movements, normal rapid alternating patting, normal foot taps and normal foot agility.  Cerebellar testing: No dysmetria or intention tremor on finger to nose testing. Heel to shin is unremarkable bilaterally. There is no truncal or gait ataxia.  Sensory exam: intact to light touch in the upper and lower extremities.  Gait, station and balance: She stands easily. No veering to one side is noted. No leaning to one side is noted. Posture is age-appropriate and stance is narrow based. Gait shows normal stride length and normal pace. No problems turning are noted. Tandem walk is unremarkable.   Assessment and Plan:   In summary, THERISA MENNELLA is a very pleasant 60 year old female with an underlying medical history of hypertension, hyperlipidemia, anxiety, reflux disease, carotid artery disease, status post right carotid endarterectomy with patch angioplasty on 02/01/2012, who presents for follow-up consultation of her moderate obstructive sleep apnea, established on autoBiPAP therapy with good results and excellent compliance. Air leak is still an issue, but she is a side sleeper. Her leak has been consistently high mostly for that reason. She is commended for her  treatment adherence. She needs new supplies and I placed an order for this. We briefly talked about his sleep study results again. She had a  baseline sleep study on 12/10/2013 and a CPAP titration study on 12/23/2013. I  went over her compliance data results with her as well. Her physical exam is stable, with the exception that she has been able to lose weight. She was recently diagnosed with diabetes and placed on metformin. I suggested a one-year checkup. She is commended for her weight loss endeavors as well. I answered all her questions today and she was in agreement.   I spent 25 minutes in total face-to-face time with the patient, more than 50% of which was spent in counseling and coordination of care, reviewing test results, reviewing medication and discussing or reviewing the diagnosis of OSA, its prognosis and treatment options.

## 2015-10-11 ENCOUNTER — Ambulatory Visit (HOSPITAL_COMMUNITY)
Admission: RE | Admit: 2015-10-11 | Discharge: 2015-10-11 | Disposition: A | Discharge status: Home / Self Care | Payer: BC Managed Care – PPO | Source: Ambulatory Visit | Attending: Family | Admitting: Family

## 2015-10-11 ENCOUNTER — Ambulatory Visit: Payer: BC Managed Care – PPO | Admitting: Family

## 2015-10-11 ENCOUNTER — Encounter (HOSPITAL_COMMUNITY): Payer: BC Managed Care – PPO

## 2015-10-11 DIAGNOSIS — Z87891 Personal history of nicotine dependence: Secondary | ICD-10-CM | POA: Diagnosis not present

## 2015-10-11 DIAGNOSIS — Z48812 Encounter for surgical aftercare following surgery on the circulatory system: Secondary | ICD-10-CM

## 2015-10-11 DIAGNOSIS — Z9889 Other specified postprocedural states: Secondary | ICD-10-CM

## 2015-10-11 LAB — VAS US CAROTID
LCCADSYS: 63 cm/s
LCCAPDIAS: 11 cm/s
LEFT ECA DIAS: -10 cm/s
LICADDIAS: -16 cm/s
Left CCA dist dias: 17 cm/s
Left CCA prox sys: 101 cm/s
Left ICA dist sys: -65 cm/s
Left ICA prox dias: -26 cm/s
Left ICA prox sys: -91 cm/s
RCCADSYS: -76 cm/s
RIGHT CCA MID DIAS: 10 cm/s
RIGHT ECA DIAS: -11 cm/s
Right CCA prox dias: 8 cm/s
Right CCA prox sys: 79 cm/s

## 2015-10-12 ENCOUNTER — Encounter: Payer: Self-pay | Admitting: Family

## 2015-10-14 ENCOUNTER — Encounter: Payer: Self-pay | Admitting: Family

## 2015-10-14 ENCOUNTER — Ambulatory Visit (INDEPENDENT_AMBULATORY_CARE_PROVIDER_SITE_OTHER): Payer: BC Managed Care – PPO | Admitting: Family

## 2015-10-14 VITALS — BP 133/74 | HR 73 | Temp 98.2°F | Resp 16 | Ht 66.0 in | Wt 168.8 lb

## 2015-10-14 DIAGNOSIS — Z87891 Personal history of nicotine dependence: Secondary | ICD-10-CM | POA: Diagnosis not present

## 2015-10-14 DIAGNOSIS — Z9889 Other specified postprocedural states: Secondary | ICD-10-CM

## 2015-10-14 DIAGNOSIS — I6521 Occlusion and stenosis of right carotid artery: Secondary | ICD-10-CM | POA: Diagnosis not present

## 2015-10-14 NOTE — Progress Notes (Signed)
Chief Complaint: Follow up Extracranial Carotid Artery Stenosis   History of Present Illness  Rebecca Mathis is a 60 y.o. female patient of Dr. Trula Slade who is status post right carotid endarterectomy with patch angioplasty on 02/01/2012. This was done for asymptomatic right carotid stenosis. Intraoperative findings included an 85% stenosis. She returns today for follow up. She states that she has white coat syndrome and her blood pressure goes up; states otherwise her blood pressure runs about 114/70.  Pt denies any history of stroke or TIA, specifically she denies a history of hemiparesis, monocular loss of vision, unilateral facial drooping, or aphasia. She exercises 3 days/week at the gym, is also doing resistance exercises.  She walks on her treadmill 3 minutes daily, and 1-2 hours/day at work.   She was diagnosed with obstructive sleep apnea in December 2015, uses bipap and feels better.  Pt Diabetic: yes, was diagnosed with DM in June 2017, states her A1C at that time was 15 at that time, states her most recent A1C was 6.0 Pt smoker: former smoker, quit in 2013  Pt meds include: Statin : Yes ASA: Yes Other anticoagulants/antiplatelets: no   Past Medical History:  Diagnosis Date  . Anxiety   . Carotid artery occlusion   . Diabetes mellitus without complication ([place])    during pregnancy  . Dyslipidemia   . GERD (gastroesophageal reflux disease)    INDIGESTION W/ SOME FOODS    . Heart murmur   . Hyperlipidemia   . Hypertension     Social History Social History  Substance Use Topics  . Smoking status: Former Smoker    Quit date: 09/18/2011  . Smokeless tobacco: Never Used  . Alcohol use No    Family History Family History  Problem Relation Age of Onset  . Coronary artery disease Mother   . Cancer Mother     ovarian  . Diabetes Mother   . Heart disease Mother   . Heart attack Mother   . Coronary artery disease Father   . Other Father     varicose veins   . Heart disease Sister   . Hypertension Sister   . Heart disease Brother   . Diabetes Brother   . Restless legs syndrome Sister     Surgical History Past Surgical History:  Procedure Laterality Date  . caratoid artery  01/14  . CAROTID ENDARTERECTOMY Right 02-01-12   cea  . CESAREAN SECTION  1984  . ENDARTERECTOMY  02/01/2012   Procedure: ENDARTERECTOMY CAROTID;  Surgeon: Serafina Mitchell, MD;  Location: [facility] OR;  Service: Vascular;  Laterality: Right;  . NM [place]  11/01/2011   The post stress myocardial perfusion images show a normal pattern of perfusion in all regions. The post stress left ventricle is normal in size. The post-stress ejection fraction is 84%. No significant wall motion abnormalities noted.  Marland Kitchen PATCH ANGIOPLASTY  02/01/2012   Procedure: PATCH ANGIOPLASTY;  Surgeon: Serafina Mitchell, MD;  Location: [facility] OR;  Service: Vascular;  Laterality: Right;  Using 1 x 6 cm vascu- guard patch     Allergies  Allergen Reactions  . Lipitor [Atorvastatin] Nausea Only  . Penicillins Nausea Only  . Lidocaine Other (See Comments)    Temporary vision loss  . Restylane-L [Hyaluronic Acid-Lidocaine]     TEMPORARY VISION  LOSS     Current Outpatient Prescriptions  Medication Sig Dispense Refill  . amLODipine (NORVASC) 10 MG tablet Take 10 mg by mouth daily.    Marland Kitchen  aspirin 325 MG tablet Take 0.5 mg by mouth daily. Patient states she takes half at night.    Marland Kitchen b complex vitamins tablet Take 1 tablet by mouth daily.    . Biotin 5000 MCG CAPS Take by mouth.    . Cholecalciferol (D3 ADULT PO) Take 2,000 mcg by mouth daily.    . Coenzyme Q-10 100 MG capsule Take 300 mg by mouth daily.     . fenofibrate (TRICOR) 145 MG tablet 145 mg daily.    . fish oil-omega-3 fatty acids 1000 MG capsule Take 1 g by mouth daily.    . hydrochlorothiazide (HYDRODIURIL) 25 MG tablet Take 25 mg by mouth daily.     . metFORMIN (GLUCOPHAGE) 500 MG tablet 500 mg 2 (two) times daily.    . milk  thistle 175 MG tablet Take 175 mg by mouth daily.    . NORVASC 10 MG tablet Take by mouth daily.    . Nutritional Supplements (JUICE PLUS FIBRE PO) Take by mouth 2 (two) times daily. Takes 6 tablets  daily    . SPIRULINA PO Take by mouth.    Marland Kitchen UNABLE TO FIND Med Name: tumeric    . Vitamin D-Vitamin K (K2 PLUS D3 PO) Take by mouth.     No current facility-administered medications for this visit.   +  Review of Systems : See HPI for pertinent positives and negatives.  Physical Examination  Vitals:   10/14/15 1419 10/14/15 1427  BP: (!) 146/76 133/74  Pulse: 73   Resp: 16   Temp: 98.2 F (36.8 C)   TempSrc: Oral   SpO2: 96%   Weight: 168 lb 12.8 oz (76.6 kg)   Height: 5\' 6"  (1.676 m)    Body mass index is 27.25 kg/m.  General: WDWN obese female in NAD GAIT: normal Eyes: PERRLA Pulmonary: Respirations are non-labored, CTAB, good air movement in all fields.   Cardiac: regular rhythm and rate, no detected murmur.  VASCULAR EXAM Carotid Bruits Right Left   Negative Negative  . Radial pulses are 2+ palpable and equal.      LE Pulses Right Left   POPLITEAL not palpable  not palpable   POSTERIOR TIBIAL  palpable   palpable    DORSALIS PEDIS  ANTERIOR TIBIAL palpable  palpable     Gastrointestinal: soft, nontender, BS WNL, no r/g,no palpable masses.  Musculoskeletal: No muscle atrophy/wasting. M/S 5/5 throughout, Extremities without ischemic changes.  Neurologic: A&O X 3; Appropriate Affect,  Speech is normal CN 2-12 intact, Pain and light touch intact in extremities, Motor exam as listed above.   Assessment: Rebecca Mathis is a 60 y.o. female who is status post right carotid endarterectomy with patch angioplasty on 02/01/2012. This was done for asymptomatic right carotid stenosis. She  has no history of stroke or TIA.  DATA Today's carotid duplex suggests a widely patent right endarterectomy with no evidence of restenosis or hyperplasia and minimal left ICA stenosis.  Both vertebral arteries are antegrade, both subclavian arteries are multiphasic.  No significant change compared to a year ago.    Plan: Follow-up in 1 year with Carotid Duplex scan.   I discussed in depth with the patient the nature of atherosclerosis, and emphasized the importance of maximal medical management including strict control of blood pressure, blood glucose, and lipid levels, obtaining regular exercise, and continued cessation of smoking.  The patient is aware that without maximal medical management the underlying atherosclerotic disease process will progress, limiting the benefit of  any interventions. The patient was given information about stroke prevention and what symptoms should prompt the patient to seek immediate medical care. Thank you for allowing [place] to participate in this patient's care.  Clemon Chambers, RN, MSN, FNP-C [facility] Office: [phone]  Clinic Physician: Oneida Alar  10/14/15 2:47 PM

## 2015-10-14 NOTE — Patient Instructions (Signed)
Stroke Prevention Some medical conditions and behaviors are associated with an increased chance of having a stroke. You may prevent a stroke by making healthy choices and managing medical conditions. HOW CAN I REDUCE MY RISK OF HAVING A STROKE?   Stay physically active. Get at least 30 minutes of activity on most or all days.  Do not smoke. It may also be helpful to avoid exposure to secondhand smoke.  Limit alcohol use. Moderate alcohol use is considered to be:  No more than 2 drinks per day for men.  No more than 1 drink per day for nonpregnant women.  Eat healthy foods. This involves:  Eating 5 or more servings of fruits and vegetables a day.  Making dietary changes that address high blood pressure (hypertension), high cholesterol, diabetes, or obesity.  Manage your cholesterol levels.  Making food choices that are high in fiber and low in saturated fat, trans fat, and cholesterol may control cholesterol levels.  Take any prescribed medicines to control cholesterol as directed by your health care provider.  Manage your diabetes.  Controlling your carbohydrate and sugar intake is recommended to manage diabetes.  Take any prescribed medicines to control diabetes as directed by your health care provider.  Control your hypertension.  Making food choices that are low in salt (sodium), saturated fat, trans fat, and cholesterol is recommended to manage hypertension.  Ask your health care provider if you need treatment to lower your blood pressure. Take any prescribed medicines to control hypertension as directed by your health care provider.  If you are 18-39 years of age, have your blood pressure checked every 3-5 years. If you are 40 years of age or older, have your blood pressure checked every year.  Maintain a healthy weight.  Reducing calorie intake and making food choices that are low in sodium, saturated fat, trans fat, and cholesterol are recommended to manage  weight.  Stop drug abuse.  Avoid taking birth control pills.  Talk to your health care provider about the risks of taking birth control pills if you are over 35 years old, smoke, get migraines, or have ever had a blood clot.  Get evaluated for sleep disorders (sleep apnea).  Talk to your health care provider about getting a sleep evaluation if you snore a lot or have excessive sleepiness.  Take medicines only as directed by your health care provider.  For some people, aspirin or blood thinners (anticoagulants) are helpful in reducing the risk of forming abnormal blood clots that can lead to stroke. If you have the irregular heart rhythm of atrial fibrillation, you should be on a blood thinner unless there is a good reason you cannot take them.  Understand all your medicine instructions.  Make sure that other conditions (such as anemia or atherosclerosis) are addressed. SEEK IMMEDIATE MEDICAL CARE IF:   You have sudden weakness or numbness of the face, arm, or leg, especially on one side of the body.  Your face or eyelid droops to one side.  You have sudden confusion.  You have trouble speaking (aphasia) or understanding.  You have sudden trouble seeing in one or both eyes.  You have sudden trouble walking.  You have dizziness.  You have a loss of balance or coordination.  You have a sudden, severe headache with no known cause.  You have new chest pain or an irregular heartbeat. Any of these symptoms may represent a serious problem that is an emergency. Do not wait to see if the symptoms will   go away. Get medical help at once. Call your local emergency services (911 in [place] Do not drive yourself to the hospital.   This information is not intended to replace advice given to you by your health care provider. Make sure you discuss any questions you have with your health care provider.   Document Released: 02/03/2004 Document Revised: 01/16/2014 Document Reviewed:  06/28/2012 Elsevier Interactive Patient Education 2016 [employer]  

## 2015-10-15 NOTE — Addendum Note (Signed)
Addended by: Kaleen Mask on: 10/15/2015 04:38 PM   Modules accepted: Orders

## 2015-10-29 ENCOUNTER — Encounter (HOSPITAL_COMMUNITY): Payer: BC Managed Care – PPO

## 2015-10-29 ENCOUNTER — Ambulatory Visit: Payer: BC Managed Care – PPO | Admitting: Family

## 2016-07-21 ENCOUNTER — Other Ambulatory Visit (HOSPITAL_COMMUNITY): Payer: Self-pay | Admitting: Family Medicine

## 2016-07-21 DIAGNOSIS — Z1231 Encounter for screening mammogram for malignant neoplasm of breast: Secondary | ICD-10-CM

## 2016-08-28 ENCOUNTER — Ambulatory Visit (INDEPENDENT_AMBULATORY_CARE_PROVIDER_SITE_OTHER): Payer: BC Managed Care – PPO | Admitting: Neurology

## 2016-08-28 ENCOUNTER — Encounter: Payer: Self-pay | Admitting: Neurology

## 2016-08-28 VITALS — BP 150/79 | HR 66 | Resp 16 | Ht 66.0 in | Wt 183.0 lb

## 2016-08-28 DIAGNOSIS — G4733 Obstructive sleep apnea (adult) (pediatric): Secondary | ICD-10-CM | POA: Diagnosis not present

## 2016-08-28 NOTE — Progress Notes (Signed)
Subjective:    Patient ID: Rebecca Mathis is a 61 y.o. female.  HPI     Interim history:   Rebecca Mathis is a 61-year-old right-handed woman with an underlying medical history of hypertension, hyperlipidemia, anxiety, reflux disease, carotid artery disease, status post right carotid endarterectomy with patch angioplasty on 02/01/2012, who presents for follow-up consultation of her obstructive sleep apnea, established on autoBiPAP therapy after she underwent her two sleep studies. The patient is unaccompanied today. I last saw her on 08/26/2015, at which time she was doing well, she had recently been started on medication for diabetes.   Today, 08/28/2016: I reviewed her autoBiPAP compliance data from 07/25/2016 through 08/23/2016, which is a total of 30 days, during which time she used her machine every night with percent used days greater than 4 hours at 87%, indicating very good compliance with an average usage of 5 hours and 19 minutes however only, residual AHI at goal its your 0.8 per hour, leak on the high side with the 95th percentile at 22.2 L/m on a pressure of 20/8 cm. She is currently using the [facility]. Sees her vasc surgeon once a year, and has C. Doppler once a year, with Dr. Brabham. She feels well, does indicate that she gained a little bit overweight, most likely because of not exercising regularly. She tries to eat healthy. Her A1c was below 7 last time they checked. She tolerates the mask and the pressure settings on her auto BiPAP. Currently she is not on amlodipine and has been started on Cozaar 100 mg once daily, she stopped fish oil and started taking a planned based omega supplement. We also received a faxed compliance report from her DME company for the past 6 months and her compliance percentage was 86%.  Previously (copied from previous notes for reference):   I saw her on 08/25/2014, at which time she was compliant with BiPAP, she reported doing well, was using a full  facemask, did report a recent ER visit secondary to illness including nausea, vomiting, low-grade fever. I suggested a one-year checkup as she was doing well from the sleep apnea standpoint.   I reviewed BiPAP compliance data from 07/25/2015 through 08/23/2015 which is a total of 30 days during which time she used her machine every night with percent used days greater than 4 hours at 90%, indicating excellent compliance with an average usage of 5 hours and 30 minutes, residual AHI 2.2, leak high with the 95th percentile at 51.5 L/m on BiPAP auto, maximum IPAP of 20, minimum EPAP of 8, pressure support of 4.   I reviewed her BiPAP compliance data from 07/25/2014 through 08/23/2014 which is a total of 30 days during which time she used her machine every day with percent used days greater than 4 hours at 93%, indicating excellent compliance with an average usage of 6 hours and 25 minutes, residual AHI adequate at 3.4 per hour, leak at times high with the 95th percentile high at 71.8 L/m on a pressure setting of 20 cm for max IPAP, 8 cm for a minimum EPAP, pressure support of 4 cm. 95th percentile pressure at 14/10 approximately.    I first met her on 12/09/2013 at the request of her primary care provider, at which time the patient reported snoring, excessive daytime somnolence and witnessed apneas. Her family had noted apneic spells and her sister had recently been diagnosed with OSA and RLS.   I invited her back for sleep study. She had a   baseline sleep study on 12/10/2013, followed by a CPAP titration study on 12/23/2013, and I went over her test results with her in detail today. Her baseline sleep study from 12/10/2013 showed a sleep efficiency of 72.1% with a latency to sleep at 66.5 minutes and wake after sleep onset of 46.5 minutes. She had an elevated percentage of stage II sleep, and a reduced percentage of REM sleep with a prolonged REM latency. She had no significant PLMS or EKG or EEG changes. She had  moderate to loud snoring. Her total AHI was 15.9 per hour, rising to 48.3 per hour during REM sleep and 34 per hour in the supine position. Baseline oxygen saturation was only 89% or right at 90%, nadir was 78%. Based on her test results I invited her back for CPAP titration to help treat her OSA. Her CPAP titration study from 12/23/2013 showed a sleep efficiency of 78.8%, latency to sleep of 40 minutes and wake after sleep onset of 42 minutes. She had a mildly elevated percentage of stage II sleep, and a mildly decreased percentage of REM sleep with a mildly prolonged REM latency at 122 minutes. She had no significant PLMS, EKG or EEG changes. She was started on CPAP at 5 cm and titrated to a pressure of 15 cm. She had residual sleep disordered breathing and therefore was switched to BiPAP therapy. She was titrated from 16/12 cm to 19/15 cm. On the final pressure she had an AHI of 0 per hour. She preferred a full facemask. Based on the test results I prescribed stand or BiPAP therapy for her at a pressure of 19/15.   I reviewed her BiPAP compliance data from 04/05/2014 through 05/04/2014 which is a total of 30 days during which time she used her machine every night with percent used days greater than 4 hours at 97%, indicating excellent compliance with an average usage of 5 hours and 32 minutes and residual AHI at 2.3 per hour but leak high. Pressure 19/15 cm. 95th percentile of leak was at 115.5 L/m.   She works as a contract teacher. She quit smoking some 2 years ago, she drinks one coffee per day, no sodas, rare alcohol. She denies restless leg symptoms and is not aware of any leg twitching or kicking in her sleep. She has woken herself up with a gasping sensation and feels that sometimes she wakes up with her throat closing.   Her typical bedtime is reported to be around 10 PM to MN and usual wake time is around 6 to 7 AM. Sleep onset typically occurs within minutes. She reports feeling adequately rested  upon awakening. She wakes up on an average 1 times in the middle of the night and has to go to the bathroom 1 times on a typical night. She denies morning headaches. Her ESS is 2/24. She does not watch TV in bed. She lives alone. She has no pets. 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  Her Past Medical History Is Significant For: Past Medical History:  Diagnosis Date  . Anxiety   . Carotid artery occlusion   . Diabetes mellitus without complication (HCC)    during pregnancy  . Dyslipidemia   . GERD (gastroesophageal reflux disease)    INDIGESTION W/ SOME FOODS    . Heart murmur   . Hyperlipidemia   . Hypertension     Her Past Surgical History Is Significant For: Past Surgical History:  Procedure Laterality Date  . caratoid artery  01/14  .   CAROTID ENDARTERECTOMY Right 02-01-12   cea  . CESAREAN SECTION  1984  . ENDARTERECTOMY  02/01/2012   Procedure: ENDARTERECTOMY CAROTID;  Surgeon: Serafina Mitchell, MD;  Location: [facility] OR;  Service: Vascular;  Laterality: Right;  . NM [place]  11/01/2011   The post stress myocardial perfusion images show a normal pattern of perfusion in all regions. The post stress left ventricle is normal in size. The post-stress ejection fraction is 84%. No significant wall motion abnormalities noted.  Marland Kitchen PATCH ANGIOPLASTY  02/01/2012   Procedure: PATCH ANGIOPLASTY;  Surgeon: Serafina Mitchell, MD;  Location: [facility] OR;  Service: Vascular;  Laterality: Right;  Using 1 x 6 cm vascu- guard patch     Her Family History Is Significant For: Family History  Problem Relation Age of Onset  . Coronary artery disease Mother   . Cancer Mother        ovarian  . Diabetes Mother   . Heart disease Mother   . Heart attack Mother   . Coronary artery disease Father   . Other Father        varicose veins  . Heart disease Sister   . Hypertension Sister   . Heart disease Brother   . Diabetes Brother   . Restless legs syndrome Sister     Her  Social History Is Significant For: Social History   Social History  . Marital status: Single    Spouse name: N/A  . Number of children: 1  . Years of education: masters   Occupational History  .      retired   Social History Main Topics  . Smoking status: Former Smoker    Quit date: 09/18/2011  . Smokeless tobacco: Never Used  . Alcohol use No  . Drug use: No  . Sexual activity: Not Asked   Other Topics Concern  . None   Social History Narrative   Patient lives at home alone she is single.   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   Right handed.   Caffeine one cup of coffee daily.    Her Allergies Are:  Allergies  Allergen Reactions  . Lipitor [Atorvastatin] Nausea Only  . Penicillins Nausea Only  . Lidocaine Other (See Comments)    Temporary vision loss  . Restylane-L [Hyaluronic Acid-Lidocaine]     TEMPORARY VISION  LOSS   :   Her Current Medications Are:  Outpatient Encounter Prescriptions as of 08/28/2016  Medication Sig  . b complex vitamins tablet Take 1 tablet by mouth daily.  . Biotin 5000 MCG CAPS Take by mouth.  . Cholecalciferol (D3 ADULT PO) Take 2,000 mcg by mouth daily.  . Coenzyme Q-10 100 MG capsule Take 300 mg by mouth daily.   . fenofibrate (TRICOR) 145 MG tablet 145 mg daily.  . hydrochlorothiazide (HYDRODIURIL) 25 MG tablet Take 25 mg by mouth daily.   . metFORMIN (GLUCOPHAGE) 500 MG tablet 500 mg 2 (two) times daily.  . milk thistle 175 MG tablet Take 175 mg by mouth daily.  . NORVASC 10 MG tablet Take by mouth daily.  . Nutritional Supplements (JUICE PLUS FIBRE PO) Take by mouth 2 (two) times daily. Takes 6 tablets  daily  . SPIRULINA PO Take by mouth.  Marland Kitchen UNABLE TO FIND Med Name: tumeric  . Vitamin D-Vitamin K (K2 PLUS D3 PO) Take by mouth.  Marland Kitchen amLODipine (NORVASC) 10 MG tablet Take 10 mg by mouth daily.  Marland Kitchen  aspirin 325 MG tablet Take 0.5 mg by mouth daily. Patient states she takes half at night.   . fish oil-omega-3 fatty acids 1000 MG capsule Take 1 g by mouth daily.   No facility-administered encounter medications on file as of 08/28/2016.   :  Review of Systems:  Out of a complete 14 point review of systems, all are reviewed and negative with the exception of these symptoms as listed below: Review of Systems  Objective:  Neurological Exam  Physical Exam Physical Examination:   Vitals:   08/28/16 1551  BP: (!) 150/79  Pulse: 66  Resp: 16    General Examination: The patient is a very pleasant 61 y.o. female in no acute distress. She appears well-developed and well-nourished and well groomed.   HEENT: Normocephalic, atraumatic, pupils are equal, round and reactive to light and accommodation. Extraocular tracking is good without limitation to gaze excursion or nystagmus noted. Normal smooth pursuit is noted. Hearing is grossly intact. Face is symmetric with normal facial animation and normal facial sensation. Speech is clear with no dysarthria noted. There is no hypophonia. There is no lip, neck/head, jaw or voice tremor. Neck is supple with full range of passive and active motion. There are no carotid bruits on auscultation, unremarkable R CEA scar. Oropharynx exam reveals: mild mouth dryness, adequate dental hygiene and moderate airway crowding. Mallampati is class III. Tongue protrudes centrally and palate elevates symmetrically. She has a Mild overbite. Nasal inspection reveals no significant nasal mucosal bogginess or redness and no septal deviation.   Chest: Clear to auscultation without wheezing, rhonchi or crackles noted.  Heart: S1+S2+0, regular and a 2/6 systolic murmur.   Abdomen: Soft, non-tender and non-distended with normal bowel sounds appreciated on auscultation.  Extremities: There is no pitting edema in the distal lower extremities bilaterally.   Skin: Warm and dry without trophic changes noted. There are no varicose veins.  Musculoskeletal: exam  reveals no obvious joint deformities, tenderness or joint swelling or erythema.   Neurologically:  Mental status: The patient is awake, alert and oriented in all 4 spheres. Her immediate and remote memory, attention, language skills and fund of knowledge are appropriate. There is no evidence of aphasia, agnosia, apraxia or anomia. Speech is clear with normal prosody and enunciation. Thought process is linear. Mood is normal and affect is normal.  Cranial nerves II - XII are as described above under HEENT exam. In addition: shoulder shrug is normal with equal shoulder height noted. Motor exam: Normal bulk, strength and tone is noted. There is no drift, tremor or rebound. Romberg is negative. Reflexes are 1+ throughout. Fine motor skills and coordination: intact with normal finger taps, normal hand movements, normal rapid alternating patting, normal foot taps and normal foot agility.  Cerebellar testing: No dysmetria or intention tremor. There is no truncal or gait ataxia.  Sensory exam: intact to light touch in the upper and lower extremities.  Gait, station and balance: She stands easily. No veering to one side is noted. No leaning to one side is noted. Posture is age-appropriate and stance is narrow based. Gait shows normal stride length and normal pace. Tandem walk is unremarkable.   Assessment and Plan:   In summary, Rebecca Mathis is a very pleasant 61-year old female with an underlying medical history of hypertension, hyperlipidemia, anxiety, reflux disease, carotid artery disease, status post right carotid endarterectomy with patch angioplasty on 02/01/2012, who presents for follow-up consultation of her moderate obstructive sleep apnea, well established on autoBiPAP   therapy with good results and very good and ongoing compliance. Air leak is still an issue, as she is a side sleeper, but has been in the acceptable range in the past month. She uses the [facility] with success, needs an  updated Rx for supplies, which I provided. She is commended for her treatment adherence. Physical exam is stable. Of note, she had a baseline sleep study on 12/10/2013 and a CPAP titration study on 12/23/2013. I  went over her latest compliance data results with her as well. She is advised to continue treatment and FU in one year. I answered all her questions today and she was in agreement.  I spent 20 minutes in total face-to-face time with the patient, more than 50% of which was spent in counseling and coordination of care, reviewing test results, reviewing medication and discussing or reviewing the diagnosis of OSA, its prognosis and treatment options. Pertinent laboratory and imaging test results that were available during this visit with the patient were reviewed by me and considered in my medical decision making (see chart for details).    

## 2016-08-28 NOTE — Patient Instructions (Signed)
Keep up the good work with your autoBiPAP! We will see you back in 12 months for sleep apnea check up.

## 2016-10-04 ENCOUNTER — Other Ambulatory Visit: Payer: Self-pay

## 2016-10-16 ENCOUNTER — Ambulatory Visit (HOSPITAL_COMMUNITY)
Admission: RE | Admit: 2016-10-16 | Discharge: 2016-10-16 | Disposition: A | Discharge status: Home / Self Care | Payer: BC Managed Care – PPO | Source: Ambulatory Visit | Attending: Family | Admitting: Family

## 2016-10-16 ENCOUNTER — Ambulatory Visit (INDEPENDENT_AMBULATORY_CARE_PROVIDER_SITE_OTHER): Payer: BC Managed Care – PPO | Admitting: Family

## 2016-10-16 ENCOUNTER — Encounter: Payer: Self-pay | Admitting: Family

## 2016-10-16 VITALS — BP 156/82 | HR 75 | Temp 99.1°F | Resp 20 | Ht 66.0 in | Wt 188.0 lb

## 2016-10-16 DIAGNOSIS — I6521 Occlusion and stenosis of right carotid artery: Secondary | ICD-10-CM

## 2016-10-16 DIAGNOSIS — I6522 Occlusion and stenosis of left carotid artery: Secondary | ICD-10-CM | POA: Diagnosis not present

## 2016-10-16 DIAGNOSIS — Z9889 Other specified postprocedural states: Secondary | ICD-10-CM | POA: Insufficient documentation

## 2016-10-16 DIAGNOSIS — Z48812 Encounter for surgical aftercare following surgery on the circulatory system: Secondary | ICD-10-CM | POA: Insufficient documentation

## 2016-10-16 DIAGNOSIS — Z87891 Personal history of nicotine dependence: Secondary | ICD-10-CM

## 2016-10-16 LAB — VAS US CAROTID
LCCADDIAS: 21 cm/s
LEFT ECA DIAS: -12 cm/s
LEFT VERTEBRAL DIAS: -10 cm/s
LICAPDIAS: 28 cm/s
Left CCA dist sys: 75 cm/s
Left CCA prox dias: 19 cm/s
Left CCA prox sys: 150 cm/s
Left ICA dist dias: -24 cm/s
Left ICA dist sys: -105 cm/s
Left ICA prox sys: 94 cm/s
RIGHT CCA MID DIAS: 17 cm/s
RIGHT ECA DIAS: -10 cm/s
RIGHT VERTEBRAL DIAS: -21 cm/s
Right CCA prox dias: 16 cm/s
Right CCA prox sys: 169 cm/s
Right cca dist sys: -64 cm/s

## 2016-10-16 NOTE — Patient Instructions (Signed)
Stroke Prevention Some medical conditions and behaviors are associated with an increased chance of having a stroke. You may prevent a stroke by making healthy choices and managing medical conditions. How can I reduce my risk of having a stroke?  Stay physically active. Get at least 30 minutes of activity on most or all days.  Do not smoke. It may also be helpful to avoid exposure to secondhand smoke.  Limit alcohol use. Moderate alcohol use is considered to be:  No more than 2 drinks per day for men.  No more than 1 drink per day for nonpregnant women.  Eat healthy foods. This involves:  Eating 5 or more servings of fruits and vegetables a day.  Making dietary changes that address high blood pressure (hypertension), high cholesterol, diabetes, or obesity.  Manage your cholesterol levels.  Making food choices that are high in fiber and low in saturated fat, trans fat, and cholesterol may control cholesterol levels.  Take any prescribed medicines to control cholesterol as directed by your health care provider.  Manage your diabetes.  Controlling your carbohydrate and sugar intake is recommended to manage diabetes.  Take any prescribed medicines to control diabetes as directed by your health care provider.  Control your hypertension.  Making food choices that are low in salt (sodium), saturated fat, trans fat, and cholesterol is recommended to manage hypertension.  Ask your health care provider if you need treatment to lower your blood pressure. Take any prescribed medicines to control hypertension as directed by your health care provider.  If you are 18-39 years of age, have your blood pressure checked every 3-5 years. If you are 40 years of age or older, have your blood pressure checked every year.  Maintain a healthy weight.  Reducing calorie intake and making food choices that are low in sodium, saturated fat, trans fat, and cholesterol are recommended to manage  weight.  Stop drug abuse.  Avoid taking birth control pills.  Talk to your health care provider about the risks of taking birth control pills if you are over 35 years old, smoke, get migraines, or have ever had a blood clot.  Get evaluated for sleep disorders (sleep apnea).  Talk to your health care provider about getting a sleep evaluation if you snore a lot or have excessive sleepiness.  Take medicines only as directed by your health care provider.  For some people, aspirin or blood thinners (anticoagulants) are helpful in reducing the risk of forming abnormal blood clots that can lead to stroke. If you have the irregular heart rhythm of atrial fibrillation, you should be on a blood thinner unless there is a good reason you cannot take them.  Understand all your medicine instructions.  Make sure that other conditions (such as anemia or atherosclerosis) are addressed. Get help right away if:  You have sudden weakness or numbness of the face, arm, or leg, especially on one side of the body.  Your face or eyelid droops to one side.  You have sudden confusion.  You have trouble speaking (aphasia) or understanding.  You have sudden trouble seeing in one or both eyes.  You have sudden trouble walking.  You have dizziness.  You have a loss of balance or coordination.  You have a sudden, severe headache with no known cause.  You have new chest pain or an irregular heartbeat. Any of these symptoms may represent a serious problem that is an emergency. Do not wait to see if the symptoms will go away.   Get medical help at once. Call your local emergency services (911 in [place] Do not drive yourself to the hospital. This information is not intended to replace advice given to you by your health care provider. Make sure you discuss any questions you have with your health care provider. Document Released: 02/03/2004 Document Revised: 06/03/2015 Document Reviewed: 06/28/2012 Elsevier  Interactive Patient Education  2017 [employer]  

## 2016-10-16 NOTE — Progress Notes (Signed)
Chief Complaint: Follow up Extracranial Carotid Artery Stenosis   History of Present Illness  Rebecca Mathis is a 61 y.o. female who is status post right carotid endarterectomy with patch angioplasty on 02/01/2012 by Dr. Trula Slade. This was done for asymptomatic right carotid stenosis. Intraoperative findings included an 85% stenosis. She returns today for follow up.  Pt denies any history of stroke or TIA, specifically she denies a history of hemiparesis, monocular loss of vision, unilateral facial drooping, or aphasia. She exercises 3 days/week at the gym, is also doing resistance exercises.  She walks on her treadmill 3 minutes daily, and 1-2 hours/day at work.   She states Cozaar caused severe myalgias.  She states he blood pressure at her PCP was 128/60's, has white coat syndrome.   She was diagnosed with obstructive sleep apnea in December 2015, uses bipap and feels better.  She states she eats a mostly vegetarian diet.   Pt Diabetic: yes, was diagnosed with DM in June 2017, states her A1C at that time was 15 at that time, states her most recent A1C was 6.? Pt smoker: former smoker, quit in 2013  Pt meds include: Statin : Yes ASA: Yes Other anticoagulants/antiplatelets: no   Past Medical History:  Diagnosis Date  . Anxiety   . Carotid artery occlusion   . Diabetes mellitus without complication ([place])    during pregnancy  . Dyslipidemia   . GERD (gastroesophageal reflux disease)    INDIGESTION W/ SOME FOODS    . Heart murmur   . Hyperlipidemia   . Hypertension     Social History Social History  Substance Use Topics  . Smoking status: Former Smoker    Quit date: 09/18/2011  . Smokeless tobacco: Never Used  . Alcohol use No    Family History Family History  Problem Relation Age of Onset  . Coronary artery disease Mother   . Cancer Mother        ovarian  . Diabetes Mother   . Heart disease Mother   . Heart attack Mother   . Coronary artery disease  Father   . Other Father        varicose veins  . Heart disease Sister   . Hypertension Sister   . Heart disease Brother   . Diabetes Brother   . Restless legs syndrome Sister     Surgical History Past Surgical History:  Procedure Laterality Date  . caratoid artery  01/14  . CAROTID ENDARTERECTOMY Right 02-01-12   cea  . CESAREAN SECTION  1984  . ENDARTERECTOMY  02/01/2012   Procedure: ENDARTERECTOMY CAROTID;  Surgeon: Serafina Mitchell, MD;  Location: [facility] OR;  Service: Vascular;  Laterality: Right;  . NM [place]  11/01/2011   The post stress myocardial perfusion images show a normal pattern of perfusion in all regions. The post stress left ventricle is normal in size. The post-stress ejection fraction is 84%. No significant wall motion abnormalities noted.  Marland Kitchen PATCH ANGIOPLASTY  02/01/2012   Procedure: PATCH ANGIOPLASTY;  Surgeon: Serafina Mitchell, MD;  Location: [facility] OR;  Service: Vascular;  Laterality: Right;  Using 1 x 6 cm vascu- guard patch     Allergies  Allergen Reactions  . Lipitor [Atorvastatin] Nausea Only  . Penicillins Nausea Only  . Lidocaine Other (See Comments)    Temporary vision loss  . Restylane-L [Hyaluronic Acid-Lidocaine]     TEMPORARY VISION  LOSS     Current Outpatient Prescriptions  Medication Sig Dispense  Refill  . amLODipine (NORVASC) 10 MG tablet Take 10 mg by mouth daily.    Marland Kitchen aspirin 325 MG tablet Take 0.5 mg by mouth daily. Patient states she takes half at night.    Marland Kitchen b complex vitamins tablet Take 1 tablet by mouth daily.    . Biotin 5000 MCG CAPS Take by mouth.    . Cholecalciferol (D3 ADULT PO) Take 2,000 mcg by mouth daily.    . Coenzyme Q-10 100 MG capsule Take 300 mg by mouth daily.     . fenofibrate (TRICOR) 145 MG tablet 145 mg daily.    . fish oil-omega-3 fatty acids 1000 MG capsule Take 1 g by mouth daily.    . hydrochlorothiazide (HYDRODIURIL) 25 MG tablet Take 25 mg by mouth daily.     . Magnesium 200 MG TABS Take 600  mg by mouth.    . Magnesium 500 MG TABS Take by mouth.    . metFORMIN (GLUCOPHAGE) 500 MG tablet 500 mg 2 (two) times daily.    . milk thistle 175 MG tablet Take 175 mg by mouth daily.    . NORVASC 10 MG tablet Take by mouth daily.    . Nutritional Supplements (JUICE PLUS FIBRE PO) Take by mouth 2 (two) times daily. Takes 6 tablets  daily    . Specialty Vitamins Products (MAGNESIUM, AMINO ACID CHELATE,) 133 MG tablet Take 1 tablet by mouth 2 (two) times daily.    Marland Kitchen SPIRULINA PO Take by mouth.    Marland Kitchen UNABLE TO FIND Med Name: tumeric    . Vitamin D-Vitamin K (K2 PLUS D3 PO) Take by mouth.     No current facility-administered medications for this visit.     Review of Systems : See HPI for pertinent positives and negatives.  Physical Examination  Vitals:   10/16/16 1535 10/16/16 1541  BP: (!) 160/84 (!) 156/82  Pulse: 75   Resp: 20   Temp: 99.1 F (37.3 C)   TempSrc: Oral   SpO2: 98%   Weight: 188 lb (85.3 kg)   Height: 5\' 6"  (1.676 m)    Body mass index is 30.34 kg/m.  General: WDWN obese female in NAD GAIT:normal Eyes: PERRLA Pulmonary: Respirations are non-labored, CTAB, good air movement in all fields.   Cardiac: regular rhythm and rate, no detected murmur.  VASCULAR EXAM Carotid Bruits Right Left   Negative Negative  . Radial pulses are 2+ palpable and equal.      LE Pulses Right Left   POPLITEAL not palpable  not palpable   POSTERIOR TIBIAL  palpable   palpable    DORSALIS PEDIS  ANTERIOR TIBIAL palpable  palpable     Gastrointestinal: soft, nontender, BS WNL, no r/g,no palpable masses.  Musculoskeletal: No muscle atrophy/wasting. M/S 5/5 throughout, Extremities without ischemic changes.  Neurologic: A&O X 3; appropriate affect, speech is normal, CN 2-12 intact, Pain and light  touch intact in extremities, Motor exam as listed above.    Assessment: Rebecca Mathis is a 61 y.o. female who is status post right carotid endarterectomy with patch angioplasty on 02/01/2012. This was done for asymptomatic right carotid stenosis. She has no history of stroke or TIA.  DATA Carotid Duplex (10/16/16): Widely patent right endarterectomy with no evidence of restenosis or hyperplasia. <40% left ICA stenosis.  Both vertebral arteries are antegrade, both subclavian arteries are multiphasic.  No significant change compared to the exam on 10-11-15.    Plan: Follow-up in 18 months with Carotid Duplex scan.  I discussed in depth with the patient the nature of atherosclerosis, and emphasized the importance of maximal medical management including strict control of blood pressure, blood glucose, and lipid levels, obtaining regular exercise, and continued cessation of smoking.  The patient is aware that without maximal medical management the underlying atherosclerotic disease process will progress, limiting the benefit of any interventions. The patient was given information about stroke prevention and what symptoms should prompt the patient to seek immediate medical care. Thank you for allowing [place] to participate in this patient's care.  Clemon Chambers, RN, MSN, FNP-C [facility] Office: [phone]  Clinic Physician: Bridgett Larsson  10/16/16 3:49 PM

## 2017-02-16 ENCOUNTER — Other Ambulatory Visit (HOSPITAL_COMMUNITY): Payer: Self-pay | Admitting: Family Medicine

## 2017-02-16 DIAGNOSIS — Z1231 Encounter for screening mammogram for malignant neoplasm of breast: Secondary | ICD-10-CM

## 2017-08-29 ENCOUNTER — Ambulatory Visit: Payer: BC Managed Care – PPO | Admitting: Neurology

## 2017-08-29 ENCOUNTER — Encounter: Payer: Self-pay | Admitting: Neurology

## 2017-08-29 VITALS — BP 152/77 | HR 93 | Ht 66.0 in | Wt 188.0 lb

## 2017-08-29 DIAGNOSIS — G4733 Obstructive sleep apnea (adult) (pediatric): Secondary | ICD-10-CM

## 2017-08-29 NOTE — Patient Instructions (Signed)
If we don't find a travel autoBiPAP, we may be able to get a travel BiPAP (not auto) and set the pressure to 14/10 cm for you.  Talk to your DME about getting a machine.  You can follow up in one year.

## 2017-08-29 NOTE — Progress Notes (Signed)
Subjective:    Patient ID: Rebecca Mathis is a 62 y.o. female.  HPI     Interim history:   Rebecca Mathis is a 62 year old right-handed woman with an underlying medical history of hypertension, hyperlipidemia, anxiety, reflux disease, carotid artery disease, status post right carotid endarterectomy with patch angioplasty on 02/01/2012, who presents for follow-up consultation of her obstructive sleep apnea, established on autoBiPAP. The patient is unaccompanied today. I last saw her on 08/28/16, at which time she was compliant with her BiPAP.   Today, 08/29/17: I reviewed her auto BiPAP compliance data from 07/29/2017 through 08/27/2017 which is a total of 30 days, during which time she used her auto BiPAP 27 days, with percent used days greater than 4 hours at 80%, indicating very good compliance with an average usage of 5 hours and 8 minutes for days on treatment. Residual AHI at goal at 0.6 per hour, leak acceptable with the 95th percentile at 15 L/m on a pressure of 8/4-20/16 cm. She reports doing well on auto BiPAP. Since she has started using the [facility] full facemask she has done very well. Her average pressure is still around 14/10 typically. She would like to see if she can get a travel BiPAP machine. She knows that it may not be covered by her insurance. She has been getting her supplies online. She is planning several [place] trips with her daughter.  Previously (copied from previous notes for reference):    I saw her on 08/26/2015, at which time she was doing well, she had recently been started on medication for diabetes.    I reviewed her autoBiPAP compliance data from 07/25/2016 through 08/23/2016, which is a total of 30 days, during which time she used her machine every night with percent used days greater than 4 hours at 87%, indicating very good compliance with an average usage of 5 hours and 19 minutes however only, residual AHI at goal its your 0.8 per hour, leak on the high side with  the 95th percentile at 22.2 L/m on a pressure of 20/8 cm.   I saw her on 08/25/2014, at which time she was compliant with BiPAP, she reported doing well, was using a full facemask, did report a recent ER visit secondary to illness including nausea, vomiting, low-grade fever. I suggested a one-year checkup as she was doing well from the sleep apnea standpoint.   I reviewed BiPAP compliance data from 07/25/2015 through 08/23/2015 which is a total of 30 days during which time she used her machine every night with percent used days greater than 4 hours at 90%, indicating excellent compliance with an average usage of 5 hours and 30 minutes, residual AHI 2.2, leak high with the 95th percentile at 51.5 L/m on BiPAP auto, maximum IPAP of 20, minimum EPAP of 8, pressure support of 4.   I reviewed her BiPAP compliance data from 07/25/2014 through 08/23/2014 which is a total of 30 days during which time she used her machine every day with percent used days greater than 4 hours at 93%, indicating excellent compliance with an average usage of 6 hours and 25 minutes, residual AHI adequate at 3.4 per hour, leak at times high with the 95th percentile high at 71.8 L/m on a pressure setting of 20 cm for max IPAP, 8 cm for a minimum EPAP, pressure support of 4 cm. 95th percentile pressure at 14/10 approximately.    I first met her on 12/09/2013 at the request of her primary care provider, at  which time the patient reported snoring, excessive daytime somnolence and witnessed apneas. Her family had noted apneic spells and her sister had recently been diagnosed with OSA and RLS. I invited her back for sleep study. She had a baseline sleep study on 12/10/2013, followed by a CPAP titration study on 12/23/2013, and I went over her test results with her in detail today. Her baseline sleep study from 12/10/2013 showed a sleep efficiency of 72.1% with a latency to sleep at 66.5 minutes and wake after sleep onset of 46.5 minutes. She had  an elevated percentage of stage II sleep, and a reduced percentage of REM sleep with a prolonged REM latency. She had no significant PLMS or EKG or EEG changes. She had moderate to loud snoring. Her total AHI was 15.9 per hour, rising to 48.3 per hour during REM sleep and 34 per hour in the supine position. Baseline oxygen saturation was only 89% or right at 90%, nadir was 78%. Based on her test results I invited her back for CPAP titration to help treat her OSA. Her CPAP titration study from 12/23/2013 showed a sleep efficiency of 78.8%, latency to sleep of 40 minutes and wake after sleep onset of 42 minutes. She had a mildly elevated percentage of stage II sleep, and a mildly decreased percentage of REM sleep with a mildly prolonged REM latency at 122 minutes. She had no significant PLMS, EKG or EEG changes. She was started on CPAP at 5 cm and titrated to a pressure of 15 cm. She had residual sleep disordered breathing and therefore was switched to BiPAP therapy. She was titrated from 16/12 cm to 19/15 cm. On the final pressure she had an AHI of 0 per hour. She preferred a full facemask. Based on the test results I prescribed stand or BiPAP therapy for her at a pressure of 19/15.   I reviewed her BiPAP compliance data from 04/05/2014 through 05/04/2014 which is a total of 30 days during which time she used her machine every night with percent used days greater than 4 hours at 97%, indicating excellent compliance with an average usage of 5 hours and 32 minutes and residual AHI at 2.3 per hour but leak high. Pressure 19/15 cm. 95th percentile of leak was at 115.5 L/m.   She works as a Medical illustrator. She quit smoking some 2 years ago, she drinks one coffee per day, no sodas, rare alcohol. She denies restless leg symptoms and is not aware of any leg twitching or kicking in her sleep. She has woken herself up with a gasping sensation and feels that sometimes she wakes up with her throat closing.   Her typical  bedtime is reported to be around 10 PM to MN and usual wake time is around 6 to 7 AM. Sleep onset typically occurs within minutes. She reports feeling adequately rested upon awakening. She wakes up on an average 1 times in the middle of the night and has to go to the bathroom 1 times on a typical night. She denies morning headaches. Her ESS is 2/24. She does not watch TV in bed. She lives alone. She has no pets. 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  Her Past Medical History Is Significant For: Past Medical History:  Diagnosis Date  . Anxiety   . Carotid artery occlusion   . Diabetes mellitus without complication ([place])    during pregnancy  . Dyslipidemia   . GERD (gastroesophageal reflux disease)    INDIGESTION W/ SOME  FOODS    . Heart murmur   . Hyperlipidemia   . Hypertension     Her Past Surgical History Is Significant For: Past Surgical History:  Procedure Laterality Date  . caratoid artery  01/14  . CAROTID ENDARTERECTOMY Right 02-01-12   cea  . CESAREAN SECTION  1984  . ENDARTERECTOMY  02/01/2012   Procedure: ENDARTERECTOMY CAROTID;  Surgeon: Serafina Mitchell, MD;  Location: [facility] OR;  Service: Vascular;  Laterality: Right;  . NM [place]  11/01/2011   The post stress myocardial perfusion images show a normal pattern of perfusion in all regions. The post stress left ventricle is normal in size. The post-stress ejection fraction is 84%. No significant wall motion abnormalities noted.  Marland Kitchen PATCH ANGIOPLASTY  02/01/2012   Procedure: PATCH ANGIOPLASTY;  Surgeon: Serafina Mitchell, MD;  Location: [facility] OR;  Service: Vascular;  Laterality: Right;  Using 1 x 6 cm vascu- guard patch     Her Family History Is Significant For: Family History  Problem Relation Age of Onset  . Coronary artery disease Mother   . Cancer Mother        ovarian  . Diabetes Mother   . Heart disease Mother   . Heart attack Mother   . Coronary artery disease Father   . Other Father         varicose veins  . Heart disease Sister   . Hypertension Sister   . Heart disease Brother   . Diabetes Brother   . Restless legs syndrome Sister     Her Social History Is Significant For: Social History   Socioeconomic History  . Marital status: Single    Spouse name: Not on file  . Number of children: 1  . Years of education: masters  . Highest education level: Not on file  Occupational History    Comment: retired  Scientific laboratory technician  . Financial resource strain: Not on file  . Food insecurity:    Worry: Not on file    Inability: Not on file  . Transportation needs:    Medical: Not on file    Non-medical: Not on file  Tobacco Use  . Smoking status: Former Smoker    Last attempt to quit: 09/18/2011    Years since quitting: 5.9  . Smokeless tobacco: Never Used  Substance and Sexual Activity  . Alcohol use: No    Alcohol/week: 0.0 standard drinks  . Drug use: No  . Sexual activity: Not on file  Lifestyle  . Physical activity:    Days per week: Not on file    Minutes per session: Not on file  . Stress: Not on file  Relationships  . Social connections:    Talks on phone: Not on file    Gets together: Not on file    Attends religious service: Not on file    Active member of club or organization: Not on file    Attends meetings of clubs or organizations: Not on file    Relationship status: Not on file  Other Topics Concern  . Not on file  Social History Narrative   Patient lives at home alone she is single.   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   Right handed.   Caffeine one cup of coffee daily.    Her Allergies Are:  Allergies  Allergen Reactions  . Lipitor [Atorvastatin] Nausea Only  . Penicillins Nausea Only  . Lidocaine Other (  See Comments)    Temporary vision loss  . Restylane-L [Hyaluronic Acid-Lidocaine]     TEMPORARY VISION  LOSS   :   Her Current Medications Are:  Outpatient Encounter Medications as  of 08/29/2017  Medication Sig  . amLODipine (NORVASC) 10 MG tablet Take 10 mg by mouth daily.  Marland Kitchen b complex vitamins tablet Take 1 tablet by mouth daily.  . Cholecalciferol (D3 ADULT PO) Take 2,000 mcg by mouth daily.  . Coenzyme Q-10 100 MG capsule Take 300 mg by mouth daily.   . fenofibrate (TRICOR) 145 MG tablet 145 mg daily.  . hydrochlorothiazide (HYDRODIURIL) 25 MG tablet Take 25 mg by mouth daily.   . Magnesium 500 MG TABS Take by mouth.  . metFORMIN (GLUCOPHAGE) 500 MG tablet 500 mg 2 (two) times daily.  . milk thistle 175 MG tablet Take 175 mg by mouth daily.  . Nutritional Supplements (JUICE PLUS FIBRE PO) Take by mouth 2 (two) times daily. Takes 6 tablets  daily  . UNABLE TO FIND Med Name: tumeric  . Vitamin D-Vitamin K (K2 PLUS D3 PO) Take by mouth.  . [DISCONTINUED] aspirin 325 MG tablet Take 0.5 mg by mouth daily. Patient states she takes half at night.  . [DISCONTINUED] Biotin 5000 MCG CAPS Take by mouth.  . [DISCONTINUED] fish oil-omega-3 fatty acids 1000 MG capsule Take 1 g by mouth daily.  . [DISCONTINUED] Magnesium 200 MG TABS Take 600 mg by mouth.  . [DISCONTINUED] NORVASC 10 MG tablet Take by mouth daily.  . [DISCONTINUED] Specialty Vitamins Products (MAGNESIUM, AMINO ACID CHELATE,) 133 MG tablet Take 1 tablet by mouth 2 (two) times daily.  . [DISCONTINUED] SPIRULINA PO Take by mouth.   No facility-administered encounter medications on file as of 08/29/2017.   :  Review of Systems:  Out of a complete 14 point review of systems, all are reviewed and negative with the exception of these symptoms as listed below:  Review of Systems  Neurological:       Pt presents today to discuss her cpap. Pt is interested in a travel cpap.    Objective:  Neurological Exam  Physical Exam Physical Examination:   Vitals:   08/29/17 1511  BP: (!) 152/77  Pulse: 93    General Examination: The patient is a very pleasant 62 y.o. female in no acute distress. She appears  well-developed and well-nourished and well groomed.   HEENT:Normocephalic, atraumatic, pupils are equal, round and reactive to light and accommodation. Extraocular tracking is good without limitation to gaze excursion or nystagmus noted. Normal smooth pursuit is noted. Hearing is grossly intact. Face is symmetric with normal facial animation and normal facial sensation. Speech is clear with no dysarthria noted. There is no hypophonia. There is no lip, neck/head, jaw or voice tremor. Neck is supple with full range of passive and active motion. There are no carotid bruits on auscultation, unremarkable R CEA scar. Oropharynx exam reveals: mild mouth dryness, adequate dental hygiene and moderate airway crowding. Mallampati is class III. Tongue protrudes centrally and palate elevates symmetrically.   Chest:Clear to auscultation without wheezing, rhonchi or crackles noted.  Heart:S1+S2+0, regular and a 1/6 systolic murmur.   Abdomen:Soft, non-tender and non-distended with normal bowel sounds appreciated on auscultation.  Extremities:There is no pitting edema in the distal lower extremities bilaterally.   Skin: Warm and dry without trophic changes noted. There are no varicose veins.  Musculoskeletal: exam reveals: sore R knee (goat bumped into it)   Neurologically:  Mental status: The  patient is awake, alert and oriented in all 4 spheres. Her immediate and remote memory, attention, language skills and fund of knowledge are appropriate. There is no evidence of aphasia, agnosia, apraxia or anomia. Speech is clear with normal prosody and enunciation. Thought process is linear. Mood is normal and affect is normal.  Cranial nerves II - XII are as described above under HEENT exam.  Motor exam: Normal bulk, strength and tone is noted. There is no drift, tremor or rebound. Romberg is negative. Reflexes are 1+ throughout. Fine motor skills and coordination: intact with normal finger taps, normal hand  movements, normal rapid alternating patting, normal foot taps and normal foot agility.  Cerebellar testing: No dysmetria or intention tremor. There is no truncal or gait ataxia.  Sensory exam: intact to light touch in the upper and lower extremities.  Gait, station and balance: She stands easily. No veering to one side is noted. No leaning to one side is noted. Posture is age-appropriate and stance is narrow based. Gait shows normal stride length and normal pace. Tandem walk is unremarkable.   Assessment and Plan:   In summary, Rebecca Mathis is a very pleasant 62 year old female with an underlying medical history of hypertension, hyperlipidemia, anxiety, reflux disease, carotid artery disease, status post right carotid endarterectomy with patch angioplasty on 02/01/2012, who presents for follow-up consultation of her moderate obstructive sleep apnea, well established on autoBiPAP therapy with ongoing good results and good compliance. She works part-time as a Pharmacist, hospital. Her leak has reduced significantly in the past year. She is comfortable with the current settings. Average pressure is generally still around 14/10 cm. I advised her that we may not be able to get her on auto BiPAP machine for travel but she may be able to get a travel BiPAP machine which we could potentially set at a pressure of 14/10. She would like to look into this and also talked to her DME company about it. She may request a prescription for this and she is encouraged to call us. Otherwise, we will plan to see her back in one year routinely. She is encouraged to continue to work on weight management and be fully compliant with her BiPAP therapy. I answered all her questions today and she was in agreement. Of note, she had a baseline sleep study on12/02/2013 and a CPAP titration study on 12/23/2013. I went over her latest compliance data resultswith her as well.  I spent 25 minutes in total face-to-face time with the patient, more  than 50% of which was spent in counseling and coordination of care, reviewing test results, reviewing medication and discussing or reviewing the diagnosis of OSA, its prognosis and treatment options. Pertinent laboratory and imaging test results that were available during this visit with the patient were reviewed by me and considered in my medical decision making (see chart for details).

## 2017-09-26 ENCOUNTER — Other Ambulatory Visit (HOSPITAL_COMMUNITY): Payer: Self-pay | Admitting: Family Medicine

## 2017-09-26 DIAGNOSIS — Z1231 Encounter for screening mammogram for malignant neoplasm of breast: Secondary | ICD-10-CM

## 2017-10-26 ENCOUNTER — Ambulatory Visit: Payer: BC Managed Care – PPO | Admitting: Family

## 2017-10-26 ENCOUNTER — Encounter (HOSPITAL_COMMUNITY): Payer: BC Managed Care – PPO

## 2017-11-08 ENCOUNTER — Other Ambulatory Visit: Payer: Self-pay

## 2017-11-08 ENCOUNTER — Other Ambulatory Visit: Payer: Self-pay | Admitting: *Deleted

## 2017-11-08 ENCOUNTER — Ambulatory Visit (INDEPENDENT_AMBULATORY_CARE_PROVIDER_SITE_OTHER): Payer: BC Managed Care – PPO | Admitting: Family

## 2017-11-08 ENCOUNTER — Encounter: Payer: Self-pay | Admitting: Family

## 2017-11-08 ENCOUNTER — Ambulatory Visit (HOSPITAL_COMMUNITY)
Admission: RE | Admit: 2017-11-08 | Discharge: 2017-11-08 | Disposition: A | Discharge status: Home / Self Care | Payer: BC Managed Care – PPO | Source: Ambulatory Visit | Attending: Family | Admitting: Family

## 2017-11-08 VITALS — BP 139/77 | HR 76 | Temp 98.1°F | Resp 16 | Ht 66.0 in | Wt 185.0 lb

## 2017-11-08 DIAGNOSIS — Z9889 Other specified postprocedural states: Secondary | ICD-10-CM | POA: Diagnosis present

## 2017-11-08 DIAGNOSIS — I6521 Occlusion and stenosis of right carotid artery: Secondary | ICD-10-CM

## 2017-11-08 DIAGNOSIS — Z87891 Personal history of nicotine dependence: Secondary | ICD-10-CM | POA: Diagnosis not present

## 2017-11-08 NOTE — Patient Instructions (Signed)

## 2017-11-08 NOTE — Progress Notes (Signed)
Chief Complaint: Follow up Extracranial Carotid Artery Stenosis   History of Present Illness  Rebecca Mathis is a 62 y.o. female who is status post right carotid endarterectomy with patch angioplasty on 02/01/2012 by Dr. Trula Slade. This was done for asymptomatic right carotid stenosis. Intraoperative findings included an 85% stenosis. She returns today for follow up.  Pt denies any history of stroke or TIA, specifically she denies a history of hemiparesis, monocular loss of vision, unilateral facial drooping, oraphasia. She exercises 3days/week at the gym, is also doing resistance exercises.  She walks on her treadmill 3 minutes daily, and 1-2 hours/day at work.   She states Cozaar caused severe myalgias.   She was diagnosed with obstructive sleep apnea in December 2015, uses bipap and feels better.  She states she eats a mostly vegetarian diet.   She knows she has a heart murmur, denies dyspnea, denies chest pain, denies fatigue.   Diabetic: yes, was diagnosed with DM in June 2017, states her A1C at that time was 15, states her most recent A1C was ? Tobacco use: former smoker, quit in 2013  Pt meds include: Statin : Yes ASA: Yes Other anticoagulants/antiplatelets: no   Past Medical History:  Diagnosis Date  . Anxiety   . Carotid artery occlusion   . Diabetes mellitus without complication ([place])    during pregnancy  . Dyslipidemia   . GERD (gastroesophageal reflux disease)    INDIGESTION W/ SOME FOODS    . Heart murmur   . Hyperlipidemia   . Hypertension     Social History Social History   Tobacco Use  . Smoking status: Former Smoker    Last attempt to quit: 09/18/2011    Years since quitting: 6.1  . Smokeless tobacco: Never Used  Substance Use Topics  . Alcohol use: No    Alcohol/week: 0.0 standard drinks  . Drug use: No    Family History Family History  Problem Relation Age of Onset  . Coronary artery disease Mother   . Cancer Mother    ovarian  . Diabetes Mother   . Heart disease Mother   . Heart attack Mother   . Coronary artery disease Father   . Other Father        varicose veins  . Heart disease Sister   . Hypertension Sister   . Heart disease Brother   . Diabetes Brother   . Restless legs syndrome Sister     Surgical History Past Surgical History:  Procedure Laterality Date  . caratoid artery  01/14  . CAROTID ENDARTERECTOMY Right 02-01-12   cea  . CESAREAN SECTION  1984  . ENDARTERECTOMY  02/01/2012   Procedure: ENDARTERECTOMY CAROTID;  Surgeon: Serafina Mitchell, MD;  Location: [facility] OR;  Service: Vascular;  Laterality: Right;  . NM [place]  11/01/2011   The post stress myocardial perfusion images show a normal pattern of perfusion in all regions. The post stress left ventricle is normal in size. The post-stress ejection fraction is 84%. No significant wall motion abnormalities noted.  Marland Kitchen PATCH ANGIOPLASTY  02/01/2012   Procedure: PATCH ANGIOPLASTY;  Surgeon: Serafina Mitchell, MD;  Location: [facility] OR;  Service: Vascular;  Laterality: Right;  Using 1 x 6 cm vascu- guard patch     Allergies  Allergen Reactions  . Lipitor [Atorvastatin] Nausea Only  . Penicillins Nausea Only  . Lidocaine Other (See Comments)    Temporary vision loss  . Restylane-L [Hyaluronic Acid-Lidocaine]  TEMPORARY VISION  LOSS     Current Outpatient Medications  Medication Sig Dispense Refill  . amLODipine (NORVASC) 10 MG tablet Take 10 mg by mouth daily.    Marland Kitchen b complex vitamins tablet Take 1 tablet by mouth daily.    . Cholecalciferol (D3 ADULT PO) Take 2,000 mcg by mouth daily.    . Coenzyme Q-10 100 MG capsule Take 300 mg by mouth daily.     . fenofibrate (TRICOR) 145 MG tablet 145 mg daily.    . hydrochlorothiazide (HYDRODIURIL) 25 MG tablet Take 25 mg by mouth daily.     . Magnesium 500 MG TABS Take by mouth.    . metFORMIN (GLUCOPHAGE) 500 MG tablet 500 mg 2 (two) times daily.    . milk thistle 175 MG  tablet Take 175 mg by mouth daily.    . Nutritional Supplements (JUICE PLUS FIBRE PO) Take by mouth 2 (two) times daily. Takes 6 tablets  daily    . rosuvastatin (CRESTOR) 5 MG tablet Take 5 mg by mouth daily.    Marland Kitchen UNABLE TO FIND Med Name: tumeric    . Vitamin D-Vitamin K (K2 PLUS D3 PO) Take by mouth.     No current facility-administered medications for this visit.     Review of Systems : See HPI for pertinent positives and negatives.  Physical Examination  Vitals:   11/08/17 1336 11/08/17 1344  BP: (!) 155/79 139/77  Pulse: 74 76  Resp: 16   Temp: 98.1 F (36.7 C)   TempSrc: Oral   SpO2: 98%   Weight: 185 lb (83.9 kg)   Height: 5\' 6"  (1.676 m)    Body mass index is 29.86 kg/m.  General: WDWN female in NAD GAIT: normal Eyes: PERRLA HENT: No gross abnormalities.  Pulmonary:  Respirations are non-labored, good air movement in all fields, CTAB, no rales, no rhonchi, or wheezing. Cardiac: regular rhythm, +murmur.  VASCULAR EXAM Carotid Bruits Right Left   Negative Negative     Abdominal aortic pulse is not palpable. Radial pulses are 2+ palpable and equal.                                                                                                                            LE Pulses Right Left       POPLITEAL  not palpable   not palpable       POSTERIOR TIBIAL   palpable    palpable        DORSALIS PEDIS      ANTERIOR TIBIAL  palpable   palpable     Gastrointestinal: soft, nontender, BS WNL, no r/g, no palpable masses. Musculoskeletal: no muscle atrophy/wasting. M/S 5/5 throughout, extremities without ischemic changes. Skin: No rashes, no ulcers, no cellulitis.   Neurologic:  A&O X 3; appropriate affect, sensation is normal; speech is normal, CN 2-12 intact, pain and light touch intact in extremities, motor exam as listed above. Psychiatric: Normal thought content, mood  appropriate to clinical situation.     Assessment: Rebecca Mathis is a 62 y.o.  female who is status post right carotid endarterectomy with patch angioplasty on 02/01/2012. This was done for asymptomatic right carotid stenosis. She has no history of stroke or TIA.  Her atherosclerotic risk factors include DM, former smoker, obesity, and hypertriglyceridemia.  She takes a daily statin and ASA.  We discussed the possible addition of red krill oil to help lower her triglycerides.    DATA Carotid Duplex (11-08-17): Right ICA: CEA site with 1-39% stenosis. Left ICA: 1-39% stenosis. Bilateral vertebral artery flow is antegrade.  Bilateral subclavian artery waveforms are normal.  Slight increase in stenosis in the right ICA compared to the exam on 10-16-16.    Plan:  Follow-up in 18 months with Carotid Duplex scan.     I discussed in depth with the patient the nature of atherosclerosis, and emphasized the importance of maximal medical management including strict control of blood pressure, blood glucose, and lipid levels, obtaining regular exercise, and continued cessation of smoking.  The patient is aware that without maximal medical management the underlying atherosclerotic disease process will progress, limiting the benefit of any interventions. The patient was given information about stroke prevention and what symptoms should prompt the patient to seek immediate medical care. Thank you for allowing [place] to participate in this patient's care.  Clemon Chambers, RN, MSN, FNP-C [facility] [place] Office: [phone]  Clinic Physician: Scot Dock in vein clinic, Carlis Abbott on call  11/08/17 1:45 PM

## 2018-09-02 ENCOUNTER — Telehealth: Payer: Self-pay

## 2018-09-02 ENCOUNTER — Ambulatory Visit: Payer: BC Managed Care – PPO | Admitting: Neurology

## 2018-09-02 NOTE — Telephone Encounter (Signed)
Pt did not show for their appt with Dr. Athar today.  

## 2018-09-03 ENCOUNTER — Encounter: Payer: Self-pay | Admitting: Neurology

## 2018-12-17 ENCOUNTER — Other Ambulatory Visit: Payer: Self-pay

## 2018-12-17 ENCOUNTER — Telehealth (HOSPITAL_COMMUNITY): Payer: Self-pay | Admitting: *Deleted

## 2018-12-17 DIAGNOSIS — I6521 Occlusion and stenosis of right carotid artery: Secondary | ICD-10-CM

## 2018-12-17 NOTE — Telephone Encounter (Signed)

## 2018-12-18 ENCOUNTER — Ambulatory Visit (HOSPITAL_COMMUNITY)
Admission: RE | Admit: 2018-12-18 | Discharge: 2018-12-18 | Disposition: A | Discharge status: Home / Self Care | Payer: BC Managed Care – PPO | Source: Ambulatory Visit | Attending: Family | Admitting: Family

## 2018-12-18 ENCOUNTER — Other Ambulatory Visit: Payer: Self-pay

## 2018-12-18 ENCOUNTER — Encounter: Payer: Self-pay | Admitting: Family

## 2018-12-18 ENCOUNTER — Ambulatory Visit (INDEPENDENT_AMBULATORY_CARE_PROVIDER_SITE_OTHER): Payer: BC Managed Care – PPO | Admitting: Family

## 2018-12-18 VITALS — BP 172/85 | HR 84 | Temp 98.2°F | Resp 20 | Ht 66.0 in | Wt 184.0 lb

## 2018-12-18 DIAGNOSIS — I6521 Occlusion and stenosis of right carotid artery: Secondary | ICD-10-CM

## 2018-12-18 DIAGNOSIS — Z9889 Other specified postprocedural states: Secondary | ICD-10-CM | POA: Diagnosis not present

## 2018-12-18 DIAGNOSIS — Z87891 Personal history of nicotine dependence: Secondary | ICD-10-CM

## 2018-12-18 NOTE — Patient Instructions (Signed)

## 2018-12-18 NOTE — Progress Notes (Signed)
Chief Complaint: Follow up Extracranial Carotid Artery Stenosis   History of Present Illness  Rebecca Mathis is a 63 y.o. female who is status post right carotid endarterectomy with patch angioplasty on 01/23/2014by Dr. Trula Slade. This was done for asymptomatic right carotid stenosis. Intraoperative findings included an 85% stenosis. She returns today for follow up.  Pt denies any history of stroke or TIA, specifically she denies a history of hemiparesis, monocular loss of vision, unilateral facial drooping, oraphasia.  She states Cozaar caused severe myalgias.   She was diagnosed with obstructive sleep apnea in December 2015, uses bipap and feels better.  She states she eats a mostly vegetarian diet.  She knows she has a heart murmur, denies dyspnea, denies chest pain, denies fatigue.   She injured a ligament or tendon in her right thigh recently, and is recuperating from this. She was exercising regularly before this, states she plans on getting a rowing machine as she enjoys rowing.   She states her blood pressure usually runs 117/68, states her blood pressure increases in a medical setting.   Diabetic: yes, was diagnosed with DM in June 2017, states her A1C at that time was 15, states she does not know her most recent A1C, but a new medication was added, as she states it was uncontrolled Tobacco use: former smoker, quit in 2013  Pt meds include: Statin : Yes ASA: Yes Other anticoagulants/antiplatelets: no   Past Medical History:  Diagnosis Date  . Anxiety   . Carotid artery occlusion   . Diabetes mellitus without complication ([place])    during pregnancy  . Dyslipidemia   . GERD (gastroesophageal reflux disease)    INDIGESTION W/ SOME FOODS    . Heart murmur   . Hyperlipidemia   . Hypertension     Social History Social History   Tobacco Use  . Smoking status: Former Smoker    Quit date: 09/18/2011    Years since quitting: 7.2  . Smokeless tobacco: Never  Used  Substance Use Topics  . Alcohol use: No    Alcohol/week: 0.0 standard drinks  . Drug use: No    Family History Family History  Problem Relation Age of Onset  . Coronary artery disease Mother   . Cancer Mother        ovarian  . Diabetes Mother   . Heart disease Mother   . Heart attack Mother   . Coronary artery disease Father   . Other Father        varicose veins  . Heart disease Sister   . Hypertension Sister   . Heart disease Brother   . Diabetes Brother   . Restless legs syndrome Sister     Surgical History Past Surgical History:  Procedure Laterality Date  . caratoid artery  01/14  . CAROTID ENDARTERECTOMY Right 02-01-12   cea  . CESAREAN SECTION  1984  . ENDARTERECTOMY  02/01/2012   Procedure: ENDARTERECTOMY CAROTID;  Surgeon: Serafina Mitchell, MD;  Location: [facility] OR;  Service: Vascular;  Laterality: Right;  . NM [place]  11/01/2011   The post stress myocardial perfusion images show a normal pattern of perfusion in all regions. The post stress left ventricle is normal in size. The post-stress ejection fraction is 84%. No significant wall motion abnormalities noted.  Marland Kitchen PATCH ANGIOPLASTY  02/01/2012   Procedure: PATCH ANGIOPLASTY;  Surgeon: Serafina Mitchell, MD;  Location: [facility];  Service: Vascular;  Laterality: Right;  Using 1 x 6  cm vascu- guard patch     Allergies  Allergen Reactions  . Lipitor [Atorvastatin] Nausea Only  . Penicillins Nausea Only  . Lidocaine Other (See Comments)    Temporary vision loss  . Restylane-L [Hyaluronic Acid-Lidocaine]     TEMPORARY VISION  LOSS     Current Outpatient Medications  Medication Sig Dispense Refill  . amLODipine (NORVASC) 10 MG tablet Take 10 mg by mouth daily.    Marland Kitchen b complex vitamins tablet Take 1 tablet by mouth daily.    . Cholecalciferol (D3 ADULT PO) Take 2,000 mcg by mouth daily.    . Coenzyme Q-10 100 MG capsule Take 400 mg by mouth daily.     . hydrochlorothiazide (HYDRODIURIL) 25 MG  tablet Take 25 mg by mouth daily.     . Magnesium 500 MG TABS Take by mouth.    . metFORMIN (GLUCOPHAGE) 500 MG tablet 500 mg 2 (two) times daily.    . milk thistle 175 MG tablet Take 175 mg by mouth daily.    . Nutritional Supplements (JUICE PLUS FIBRE PO) Take by mouth 2 (two) times daily. Takes 6 tablets  daily    . rosuvastatin (CRESTOR) 5 MG tablet Take 5 mg by mouth daily.    . RYBELSUS 7 MG TABS     . UNABLE TO FIND Med Name: tumeric    . Vitamin D-Vitamin K (K2 PLUS D3 PO) Take by mouth.     No current facility-administered medications for this visit.     Review of Systems : See HPI for pertinent positives and negatives.  Physical Examination  Vitals:   12/18/18 0854 12/18/18 0858  BP: (!) 168/83 (!) 172/85  Pulse: 84   Resp: 20   Temp: 98.2 F (36.8 C)   SpO2: 98%   Weight: 184 lb (83.5 kg)   Height: 5\' 6"  (1.676 m)    Body mass index is 29.7 kg/m.  General: WDWN female in NAD GAIT: normal Eyes: Pupils are equal and round HENT: No gross abnormalities.  Pulmonary:  Respirations are non-labored, good air movement in all fields, CTAB, no rales, rhonchi, or wheezes Cardiac: regular rhythm, no detected murmur.  VASCULAR EXAM Carotid Bruits Right Left   Negative Negative     Abdominal aortic pulse is not palpable. Radial pulses are 2+ palpable and equal.                                                                                                                            LE Pulses Right Left       POPLITEAL  not palpable   not palpable       POSTERIOR TIBIAL   palpable    palpable        DORSALIS PEDIS      ANTERIOR TIBIAL not palpable  not palpable     Gastrointestinal: soft, nontender, BS WNL, no r/g, no palpable masses. Musculoskeletal: no muscle atrophy/wasting. M/S 5/5 throughout, extremities without ischemic  changes Skin: No rashes, no ulcers, no cellulitis.   Neurologic:  A&O X 3; appropriate affect, sensation is normal; speech is normal, CN  2-12 intact, pain and light touch intact in extremities, motor exam as listed above. Psychiatric: Normal thought content, mood appropriate to clinical situation.    DATA CarotidDuplex(12-18-18): Right ICA: CEA site with no stenosis. Left ICA: 1-39% stenosis. Bilateral vertebral artery flow is antegrade.  Bilateral subclavian artery waveforms are normal.  Stable compared to the exams on 10-16-16 and 11-08-17.    Assessment: Rebecca Mathis is a 63 y.o. female  who is status post right carotid endarterectomy with patch angioplasty on 02/01/2012. This was done for asymptomatic right carotid stenosis. She has no history of stroke or TIA.  Her atherosclerotic risk factors include DM, former smoker, obesity, and hypertriglyceridemia.  She takes a daily statin and ASA.  We discussed the possible addition of red krill oil to help lower her triglycerides.  Plan:  Follow-up in52monthswith Carotid Duplex scan.   I discussed in depth with the patient the nature of atherosclerosis, and emphasized the importance of maximal medical management including strict control of blood pressure, blood glucose, and lipid levels, obtaining regular exercise, and continued cessation of smoking.  The patient is aware that without maximal medical management the underlying atherosclerotic disease process will progress, limiting the benefit of any interventions. The patient was given information about stroke prevention and what symptoms should prompt the patient to seek immediate medical care. Thank you for allowing [place] to participate in this patient's care.  Clemon Chambers, RN, MSN, FNP-C [facility] [place] Office: [facility] Physician: Oneida Alar  12/18/18 9:12 AM

## 2019-01-07 ENCOUNTER — Other Ambulatory Visit: Payer: Self-pay | Admitting: *Deleted

## 2019-01-07 DIAGNOSIS — I6521 Occlusion and stenosis of right carotid artery: Secondary | ICD-10-CM

## 2019-01-07 DIAGNOSIS — Z9889 Other specified postprocedural states: Secondary | ICD-10-CM

## 2020-03-12 DIAGNOSIS — E118 Type 2 diabetes mellitus with unspecified complications: Secondary | ICD-10-CM | POA: Diagnosis not present

## 2020-03-12 DIAGNOSIS — Z6833 Body mass index (BMI) 33.0-33.9, adult: Secondary | ICD-10-CM | POA: Diagnosis not present

## 2020-03-12 DIAGNOSIS — I10 Essential (primary) hypertension: Secondary | ICD-10-CM | POA: Diagnosis not present

## 2020-03-12 DIAGNOSIS — Z0001 Encounter for general adult medical examination with abnormal findings: Secondary | ICD-10-CM | POA: Diagnosis not present

## 2020-03-12 DIAGNOSIS — E1165 Type 2 diabetes mellitus with hyperglycemia: Secondary | ICD-10-CM | POA: Diagnosis not present

## 2020-05-11 DIAGNOSIS — M9902 Segmental and somatic dysfunction of thoracic region: Secondary | ICD-10-CM | POA: Diagnosis not present

## 2020-05-11 DIAGNOSIS — S233XXA Sprain of ligaments of thoracic spine, initial encounter: Secondary | ICD-10-CM | POA: Diagnosis not present

## 2020-05-11 DIAGNOSIS — M9901 Segmental and somatic dysfunction of cervical region: Secondary | ICD-10-CM | POA: Diagnosis not present

## 2020-05-11 DIAGNOSIS — S134XXA Sprain of ligaments of cervical spine, initial encounter: Secondary | ICD-10-CM | POA: Diagnosis not present

## 2020-05-11 DIAGNOSIS — M531 Cervicobrachial syndrome: Secondary | ICD-10-CM | POA: Diagnosis not present

## 2020-05-14 DIAGNOSIS — M9902 Segmental and somatic dysfunction of thoracic region: Secondary | ICD-10-CM | POA: Diagnosis not present

## 2020-05-14 DIAGNOSIS — S233XXA Sprain of ligaments of thoracic spine, initial encounter: Secondary | ICD-10-CM | POA: Diagnosis not present

## 2020-05-14 DIAGNOSIS — M9901 Segmental and somatic dysfunction of cervical region: Secondary | ICD-10-CM | POA: Diagnosis not present

## 2020-05-14 DIAGNOSIS — M531 Cervicobrachial syndrome: Secondary | ICD-10-CM | POA: Diagnosis not present

## 2020-05-14 DIAGNOSIS — S134XXA Sprain of ligaments of cervical spine, initial encounter: Secondary | ICD-10-CM | POA: Diagnosis not present

## 2020-05-18 DIAGNOSIS — M9902 Segmental and somatic dysfunction of thoracic region: Secondary | ICD-10-CM | POA: Diagnosis not present

## 2020-05-18 DIAGNOSIS — S134XXA Sprain of ligaments of cervical spine, initial encounter: Secondary | ICD-10-CM | POA: Diagnosis not present

## 2020-05-18 DIAGNOSIS — S233XXA Sprain of ligaments of thoracic spine, initial encounter: Secondary | ICD-10-CM | POA: Diagnosis not present

## 2020-05-18 DIAGNOSIS — M9901 Segmental and somatic dysfunction of cervical region: Secondary | ICD-10-CM | POA: Diagnosis not present

## 2020-05-18 DIAGNOSIS — M531 Cervicobrachial syndrome: Secondary | ICD-10-CM | POA: Diagnosis not present

## 2020-05-24 DIAGNOSIS — M531 Cervicobrachial syndrome: Secondary | ICD-10-CM | POA: Diagnosis not present

## 2020-05-24 DIAGNOSIS — S233XXA Sprain of ligaments of thoracic spine, initial encounter: Secondary | ICD-10-CM | POA: Diagnosis not present

## 2020-05-24 DIAGNOSIS — S134XXA Sprain of ligaments of cervical spine, initial encounter: Secondary | ICD-10-CM | POA: Diagnosis not present

## 2020-05-24 DIAGNOSIS — M9902 Segmental and somatic dysfunction of thoracic region: Secondary | ICD-10-CM | POA: Diagnosis not present

## 2020-05-24 DIAGNOSIS — M9901 Segmental and somatic dysfunction of cervical region: Secondary | ICD-10-CM | POA: Diagnosis not present

## 2020-05-31 DIAGNOSIS — M9902 Segmental and somatic dysfunction of thoracic region: Secondary | ICD-10-CM | POA: Diagnosis not present

## 2020-05-31 DIAGNOSIS — S233XXA Sprain of ligaments of thoracic spine, initial encounter: Secondary | ICD-10-CM | POA: Diagnosis not present

## 2020-05-31 DIAGNOSIS — M9901 Segmental and somatic dysfunction of cervical region: Secondary | ICD-10-CM | POA: Diagnosis not present

## 2020-05-31 DIAGNOSIS — S134XXA Sprain of ligaments of cervical spine, initial encounter: Secondary | ICD-10-CM | POA: Diagnosis not present

## 2020-05-31 DIAGNOSIS — M531 Cervicobrachial syndrome: Secondary | ICD-10-CM | POA: Diagnosis not present

## 2020-06-09 DIAGNOSIS — M531 Cervicobrachial syndrome: Secondary | ICD-10-CM | POA: Diagnosis not present

## 2020-06-09 DIAGNOSIS — S233XXA Sprain of ligaments of thoracic spine, initial encounter: Secondary | ICD-10-CM | POA: Diagnosis not present

## 2020-06-09 DIAGNOSIS — S134XXA Sprain of ligaments of cervical spine, initial encounter: Secondary | ICD-10-CM | POA: Diagnosis not present

## 2020-06-09 DIAGNOSIS — M9902 Segmental and somatic dysfunction of thoracic region: Secondary | ICD-10-CM | POA: Diagnosis not present

## 2020-06-09 DIAGNOSIS — M9901 Segmental and somatic dysfunction of cervical region: Secondary | ICD-10-CM | POA: Diagnosis not present

## 2020-06-17 DIAGNOSIS — Z6832 Body mass index (BMI) 32.0-32.9, adult: Secondary | ICD-10-CM | POA: Diagnosis not present

## 2020-06-17 DIAGNOSIS — I10 Essential (primary) hypertension: Secondary | ICD-10-CM | POA: Diagnosis not present

## 2020-06-17 DIAGNOSIS — H8110 Benign paroxysmal vertigo, unspecified ear: Secondary | ICD-10-CM | POA: Diagnosis not present

## 2020-06-17 DIAGNOSIS — E6609 Other obesity due to excess calories: Secondary | ICD-10-CM | POA: Diagnosis not present

## 2020-06-17 DIAGNOSIS — E1165 Type 2 diabetes mellitus with hyperglycemia: Secondary | ICD-10-CM | POA: Diagnosis not present

## 2020-06-28 DIAGNOSIS — S233XXA Sprain of ligaments of thoracic spine, initial encounter: Secondary | ICD-10-CM | POA: Diagnosis not present

## 2020-06-28 DIAGNOSIS — M531 Cervicobrachial syndrome: Secondary | ICD-10-CM | POA: Diagnosis not present

## 2020-06-28 DIAGNOSIS — S134XXA Sprain of ligaments of cervical spine, initial encounter: Secondary | ICD-10-CM | POA: Diagnosis not present

## 2020-06-28 DIAGNOSIS — M9901 Segmental and somatic dysfunction of cervical region: Secondary | ICD-10-CM | POA: Diagnosis not present

## 2020-06-28 DIAGNOSIS — M9902 Segmental and somatic dysfunction of thoracic region: Secondary | ICD-10-CM | POA: Diagnosis not present

## 2020-07-05 DIAGNOSIS — Z1212 Encounter for screening for malignant neoplasm of rectum: Secondary | ICD-10-CM | POA: Diagnosis not present

## 2020-07-05 DIAGNOSIS — Z1211 Encounter for screening for malignant neoplasm of colon: Secondary | ICD-10-CM | POA: Diagnosis not present

## 2020-07-10 LAB — COLOGUARD: COLOGUARD: POSITIVE — AB

## 2020-07-26 DIAGNOSIS — S233XXA Sprain of ligaments of thoracic spine, initial encounter: Secondary | ICD-10-CM | POA: Diagnosis not present

## 2020-07-26 DIAGNOSIS — M531 Cervicobrachial syndrome: Secondary | ICD-10-CM | POA: Diagnosis not present

## 2020-07-26 DIAGNOSIS — S134XXA Sprain of ligaments of cervical spine, initial encounter: Secondary | ICD-10-CM | POA: Diagnosis not present

## 2020-07-26 DIAGNOSIS — M9901 Segmental and somatic dysfunction of cervical region: Secondary | ICD-10-CM | POA: Diagnosis not present

## 2020-07-26 DIAGNOSIS — M9902 Segmental and somatic dysfunction of thoracic region: Secondary | ICD-10-CM | POA: Diagnosis not present

## 2020-08-12 ENCOUNTER — Other Ambulatory Visit: Payer: Self-pay

## 2020-08-12 ENCOUNTER — Ambulatory Visit: Payer: Medicare PPO | Admitting: General Surgery

## 2020-08-12 ENCOUNTER — Encounter: Payer: Self-pay | Admitting: General Surgery

## 2020-08-12 VITALS — BP 145/77 | HR 95 | Temp 98.6°F | Resp 16 | Ht 66.0 in | Wt 182.0 lb

## 2020-08-12 DIAGNOSIS — Z1211 Encounter for screening for malignant neoplasm of colon: Secondary | ICD-10-CM | POA: Diagnosis not present

## 2020-08-12 MED ORDER — SUTAB 1479-225-188 MG PO TABS: 1.0000 | ORAL_TABLET | Freq: Once | ORAL | 0 refills | Status: AC

## 2020-08-13 NOTE — Progress Notes (Signed)
Rebecca Mathis; [id number]; 1955-02-14   HPI Patient is a 65 year old white female who was referred to my care by Dr. Sharilyn Sites for a screening colonoscopy.  Patient has never had a colonoscopy.  She recently had a positive Cologuard test.  She denies any family history of colon cancer, blood in her stools, abnormal diarrhea or constipation, or abnormal weight loss. Past Medical History:  Diagnosis Date   Anxiety    Carotid artery occlusion    Diabetes mellitus without complication ([place])    during pregnancy   Dyslipidemia    GERD (gastroesophageal reflux disease)    INDIGESTION W/ SOME FOODS     Heart murmur    Hyperlipidemia    Hypertension     Past Surgical History:  Procedure Laterality Date   caratoid artery  01/14   CAROTID ENDARTERECTOMY Right 02-01-12   [place]   ENDARTERECTOMY  02/01/2012   Procedure: ENDARTERECTOMY CAROTID;  Surgeon: Serafina Mitchell, MD;  Location: [facility] OR;  Service: Vascular;  Laterality: Right;   NM MYOCAR [place]  11/01/2011   The post stress myocardial perfusion images show a normal pattern of perfusion in all regions. The post stress left ventricle is normal in size. The post-stress ejection fraction is 84%. No significant wall motion abnormalities noted.   PATCH ANGIOPLASTY  02/01/2012   Procedure: PATCH ANGIOPLASTY;  Surgeon: Serafina Mitchell, MD;  Location: [facility] OR;  Service: Vascular;  Laterality: Right;  Using 1 x 6 cm vascu- guard patch     Family History  Problem Relation Age of Onset   Coronary artery disease Mother    Cancer Mother        ovarian   Diabetes Mother    Heart disease Mother    Heart attack Mother    Coronary artery disease Father    Other Father        varicose veins   Heart disease Sister    Hypertension Sister    Heart disease Brother    Diabetes Brother    Restless legs syndrome Sister     Current Outpatient Medications on File Prior to Visit  Medication Sig Dispense Refill    amLODipine (NORVASC) 10 MG tablet Take 10 mg by mouth daily.     b complex vitamins tablet Take 1 tablet by mouth daily.     Cholecalciferol (D3 ADULT PO) Take 2,000 mcg by mouth daily.     Coenzyme Q-10 100 MG capsule Take 400 mg by mouth daily.      hydrochlorothiazide (HYDRODIURIL) 25 MG tablet Take 25 mg by mouth daily.      Magnesium 500 MG TABS Take by mouth.     metFORMIN (GLUCOPHAGE) 500 MG tablet 500 mg 2 (two) times daily.     milk thistle 175 MG tablet Take 175 mg by mouth daily.     Nutritional Supplements (JUICE PLUS FIBRE PO) Take by mouth 2 (two) times daily. Takes 6 tablets  daily     rosuvastatin (CRESTOR) 5 MG tablet Take 5 mg by mouth daily.     RYBELSUS 7 MG TABS      UNABLE TO FIND Med Name: tumeric     Vitamin D-Vitamin K (K2 PLUS D3 PO) Take by mouth.     No current facility-administered medications on file prior to visit.    Allergies  Allergen Reactions   Lipitor [Atorvastatin] Nausea Only   Penicillins Nausea Only   Lidocaine Other (See Comments)  Temporary vision loss   Restylane-L [Sodium Hyaluronate & Lidocaine]     TEMPORARY VISION  LOSS     Social History   Substance and Sexual Activity  Alcohol Use No   Alcohol/week: 0.0 standard drinks    Social History   Tobacco Use  Smoking Status Former   Types: Cigarettes   Quit date: 09/18/2011   Years since quitting: 8.9  Smokeless Tobacco Never    Review of Systems  Constitutional: Negative.   HENT: Negative.    Eyes: Negative.   Respiratory: Negative.    Cardiovascular: Negative.   Gastrointestinal: Negative.   Genitourinary: Negative.   Musculoskeletal: Negative.   Skin: Negative.   Neurological: Negative.   Endo/Heme/Allergies: Negative.   Psychiatric/Behavioral: Negative.     Objective   Vitals:   08/12/20 0902  BP: (!) 145/77  Pulse: 95  Resp: 16  Temp: 98.6 F (37 C)  SpO2: 97%    Physical Exam Vitals reviewed.  Constitutional:      Appearance: Normal appearance.  She is not ill-appearing.  HENT:     Head: Normocephalic and atraumatic.  Cardiovascular:     Rate and Rhythm: Normal rate and regular rhythm.     Heart sounds: Normal heart sounds. No murmur heard.   No friction rub. No gallop.  Pulmonary:     Effort: Pulmonary effort is normal. No respiratory distress.     Breath sounds: Normal breath sounds. No stridor. No wheezing, rhonchi or rales.  Abdominal:     General: Bowel sounds are normal. There is no distension.     Palpations: Abdomen is soft. There is no mass.     Tenderness: There is no abdominal tenderness. There is no guarding or rebound.     Hernia: No hernia is present.  Skin:    General: Skin is warm and dry.  Neurological:     Mental Status: She is alert and oriented to person, place, and time.   Primary care notes reviewed Assessment  Need for screening colonoscopy, positive Cologuard test Plan  Patient is scheduled for a screening colonoscopy on 09/24/2020.  The risks and benefits of the procedure including bleeding and perforation were fully explained to the patient, who gave informed consent.  Sutabs have been prescribed.

## 2020-08-16 NOTE — H&P (Signed)
Rebecca Mathis; [id number]; 1955-08-27   HPI Patient is a 65 year old white female who was referred to my care by Dr. Sharilyn Sites for a screening colonoscopy.  Patient has never had a colonoscopy.  She recently had a positive Cologuard test.  She denies any family history of colon cancer, blood in her stools, abnormal diarrhea or constipation, or abnormal weight loss. Past Medical History:  Diagnosis Date   Anxiety    Carotid artery occlusion    Diabetes mellitus without complication ([place])    during pregnancy   Dyslipidemia    GERD (gastroesophageal reflux disease)    INDIGESTION W/ SOME FOODS     Heart murmur    Hyperlipidemia    Hypertension     Past Surgical History:  Procedure Laterality Date   caratoid artery  01/14   CAROTID ENDARTERECTOMY Right 02-01-12   [place]   ENDARTERECTOMY  02/01/2012   Procedure: ENDARTERECTOMY CAROTID;  Surgeon: Serafina Mitchell, MD;  Location: [facility] OR;  Service: Vascular;  Laterality: Right;   NM MYOCAR [place]  11/01/2011   The post stress myocardial perfusion images show a normal pattern of perfusion in all regions. The post stress left ventricle is normal in size. The post-stress ejection fraction is 84%. No significant wall motion abnormalities noted.   PATCH ANGIOPLASTY  02/01/2012   Procedure: PATCH ANGIOPLASTY;  Surgeon: Serafina Mitchell, MD;  Location: [facility] OR;  Service: Vascular;  Laterality: Right;  Using 1 x 6 cm vascu- guard patch     Family History  Problem Relation Age of Onset   Coronary artery disease Mother    Cancer Mother        ovarian   Diabetes Mother    Heart disease Mother    Heart attack Mother    Coronary artery disease Father    Other Father        varicose veins   Heart disease Sister    Hypertension Sister    Heart disease Brother    Diabetes Brother    Restless legs syndrome Sister     Current Outpatient Medications on File Prior to Visit  Medication Sig Dispense Refill    amLODipine (NORVASC) 10 MG tablet Take 10 mg by mouth daily.     b complex vitamins tablet Take 1 tablet by mouth daily.     Cholecalciferol (D3 ADULT PO) Take 2,000 mcg by mouth daily.     Coenzyme Q-10 100 MG capsule Take 400 mg by mouth daily.      hydrochlorothiazide (HYDRODIURIL) 25 MG tablet Take 25 mg by mouth daily.      Magnesium 500 MG TABS Take by mouth.     metFORMIN (GLUCOPHAGE) 500 MG tablet 500 mg 2 (two) times daily.     milk thistle 175 MG tablet Take 175 mg by mouth daily.     Nutritional Supplements (JUICE PLUS FIBRE PO) Take by mouth 2 (two) times daily. Takes 6 tablets  daily     rosuvastatin (CRESTOR) 5 MG tablet Take 5 mg by mouth daily.     RYBELSUS 7 MG TABS      UNABLE TO FIND Med Name: tumeric     Vitamin D-Vitamin K (K2 PLUS D3 PO) Take by mouth.     No current facility-administered medications on file prior to visit.    Allergies  Allergen Reactions   Lipitor [Atorvastatin] Nausea Only   Penicillins Nausea Only   Lidocaine Other (See Comments)  Temporary vision loss   Restylane-L [Sodium Hyaluronate & Lidocaine]     TEMPORARY VISION  LOSS     Social History   Substance and Sexual Activity  Alcohol Use No   Alcohol/week: 0.0 standard drinks    Social History   Tobacco Use  Smoking Status Former   Types: Cigarettes   Quit date: 09/18/2011   Years since quitting: 8.9  Smokeless Tobacco Never    Review of Systems  Constitutional: Negative.   HENT: Negative.    Eyes: Negative.   Respiratory: Negative.    Cardiovascular: Negative.   Gastrointestinal: Negative.   Genitourinary: Negative.   Musculoskeletal: Negative.   Skin: Negative.   Neurological: Negative.   Endo/Heme/Allergies: Negative.   Psychiatric/Behavioral: Negative.     Objective   Vitals:   08/12/20 0902  BP: (!) 145/77  Pulse: 95  Resp: 16  Temp: 98.6 F (37 C)  SpO2: 97%    Physical Exam Vitals reviewed.  Constitutional:      Appearance: Normal appearance.  She is not ill-appearing.  HENT:     Head: Normocephalic and atraumatic.  Cardiovascular:     Rate and Rhythm: Normal rate and regular rhythm.     Heart sounds: Normal heart sounds. No murmur heard.   No friction rub. No gallop.  Pulmonary:     Effort: Pulmonary effort is normal. No respiratory distress.     Breath sounds: Normal breath sounds. No stridor. No wheezing, rhonchi or rales.  Abdominal:     General: Bowel sounds are normal. There is no distension.     Palpations: Abdomen is soft. There is no mass.     Tenderness: There is no abdominal tenderness. There is no guarding or rebound.     Hernia: No hernia is present.  Skin:    General: Skin is warm and dry.  Neurological:     Mental Status: She is alert and oriented to person, place, and time.   Primary care notes reviewed Assessment  Need for screening colonoscopy, positive Cologuard test Plan  Patient is scheduled for a screening colonoscopy on 09/24/2020.  The risks and benefits of the procedure including bleeding and perforation were fully explained to the patient, who gave informed consent.  Sutabs have been prescribed.

## 2020-08-18 ENCOUNTER — Ambulatory Visit: Payer: Medicare PPO

## 2020-08-18 ENCOUNTER — Encounter (HOSPITAL_COMMUNITY): Payer: Medicare PPO

## 2020-08-19 ENCOUNTER — Other Ambulatory Visit: Payer: Self-pay

## 2020-08-19 ENCOUNTER — Ambulatory Visit (INDEPENDENT_AMBULATORY_CARE_PROVIDER_SITE_OTHER): Payer: Medicare PPO | Admitting: Physician Assistant

## 2020-08-19 ENCOUNTER — Ambulatory Visit (HOSPITAL_COMMUNITY)
Admission: RE | Admit: 2020-08-19 | Discharge: 2020-08-19 | Disposition: A | Discharge status: Home / Self Care | Payer: Medicare PPO | Source: Ambulatory Visit | Attending: Vascular Surgery | Admitting: Vascular Surgery

## 2020-08-19 VITALS — BP 141/78 | HR 90 | Temp 98.5°F | Ht 66.0 in | Wt 182.0 lb

## 2020-08-19 DIAGNOSIS — Z87891 Personal history of nicotine dependence: Secondary | ICD-10-CM

## 2020-08-19 DIAGNOSIS — Z9889 Other specified postprocedural states: Secondary | ICD-10-CM | POA: Insufficient documentation

## 2020-08-19 NOTE — Progress Notes (Signed)
HISTORY AND PHYSICAL     CC:  follow up. Requesting Provider:  Sharilyn Sites, MD  HPI: This is a 65 y.o. female here for follow up for carotid artery stenosis.  Pt is s/p right CEA for asymptomatic carotid artery stenosis on 02/01/2012 by Dr. Trula Slade.    Pt was last seen in 2020 and at that time she was doing well.  Her carotid duplex revealed 1-39% bilaterally.    Pt returns today for follow up.    Pt denies any amaurosis fugax, speech difficulties, weakness, numbness, paralysis or clumsiness or facial droop.  She is not taking a daily asa bc she doesn't think about it.  She tells me that when she had her carotid surgery she was nervous bc her brother died after CEA at the [place].    She states that her Cologaurd was positive and she is having colonoscopy on Tuesday.  Her only complaint today is that the county dug a ditch in her front yard and it was the wrong address.    The pt is on a statin for cholesterol management.  The pt is not on a daily aspirin.   Other AC:  none The pt is on CCB, HCTZ for hypertension.   The pt is not diabetic.   Tobacco hx:  former  Pt does not have family hx of AAA.  Past Medical History:  Diagnosis Date   Anxiety    Carotid artery occlusion    Diabetes mellitus without complication ([place])    during pregnancy   Dyslipidemia    GERD (gastroesophageal reflux disease)    INDIGESTION W/ SOME FOODS     Heart murmur    Hyperlipidemia    Hypertension     Past Surgical History:  Procedure Laterality Date   caratoid artery  01/14   CAROTID ENDARTERECTOMY Right 02-01-12   [place]   ENDARTERECTOMY  02/01/2012   Procedure: ENDARTERECTOMY CAROTID;  Surgeon: Serafina Mitchell, MD;  Location: [facility];  Service: Vascular;  Laterality: Right;   NM MYOCAR [place]  11/01/2011   The post stress myocardial perfusion images show a normal pattern of perfusion in all regions. The post stress left ventricle is normal in size. The  post-stress ejection fraction is 84%. No significant wall motion abnormalities noted.   PATCH ANGIOPLASTY  02/01/2012   Procedure: PATCH ANGIOPLASTY;  Surgeon: Serafina Mitchell, MD;  Location: [facility];  Service: Vascular;  Laterality: Right;  Using 1 x 6 cm vascu- guard patch     Allergies  Allergen Reactions   Lipitor [Atorvastatin] Nausea Only   Penicillins Nausea Only   Lidocaine Other (See Comments)    Temporary vision loss   Restylane-L [Sodium Hyaluronate & Lidocaine]     TEMPORARY VISION  LOSS - can tolerate without lidocaine   Latex Rash    If in contact for extended periods of time    Current Outpatient Medications  Medication Sig Dispense Refill   albuterol (VENTOLIN HFA) 108 (90 Base) MCG/ACT inhaler Inhale 2 puffs into the lungs every 6 (six) hours as needed for shortness of breath or wheezing (when around cats).     amLODipine (NORVASC) 10 MG tablet Take 10 mg by mouth daily.     Cholecalciferol (D3 ADULT PO) Take 2,000 mcg by mouth daily.     Coenzyme Q10 (CO Q-10) 400 MG CAPS Take 400 mg by mouth daily.     glipiZIDE (GLUCOTROL) 10 MG tablet Take 10  mg by mouth 2 (two) times daily.     hydrochlorothiazide (HYDRODIURIL) 25 MG tablet Take 25 mg by mouth daily.      Magnesium 500 MG TABS Take 500 mg by mouth daily.     Menaquinone-7 (VITAMIN K2 PO) Take 1 tablet by mouth daily.     metFORMIN (GLUCOPHAGE) 500 MG tablet Take 1,000 mg by mouth 2 (two) times daily.     Milk Thistle 1000 MG CAPS Take 1,000 mg by mouth daily.     NON FORMULARY Pt uses bipap nightly     Nutritional Supplements (JUICE PLUS FIBRE PO) Take 3 tablets by mouth 2 (two) times daily.     rosuvastatin (CRESTOR) 5 MG tablet Take 5 mg by mouth daily.     Semaglutide 14 MG TABS Take 14 mg by mouth daily.     vitamin B-12 (CYANOCOBALAMIN) 1000 MCG tablet Take 1,000 mcg by mouth daily.     No current facility-administered medications for this visit.    Family History  Problem Relation Age of Onset    Coronary artery disease Mother    Cancer Mother        ovarian   Diabetes Mother    Heart disease Mother    Heart attack Mother    Coronary artery disease Father    Other Father        varicose veins   Heart disease Sister    Hypertension Sister    Heart disease Brother    Diabetes Brother    Restless legs syndrome Sister     Social History   Socioeconomic History   Marital status: Single    Spouse name: Not on file   Number of children: 1   Years of education: masters   Highest education level: Not on file  Occupational History    Comment: retired  Tobacco Use   Smoking status: Former    Types: Cigarettes    Quit date: 09/18/2011    Years since quitting: 8.9   Smokeless tobacco: Never  Vaping Use   Vaping Use: Never used  Substance and Sexual Activity   Alcohol use: No    Alcohol/week: 0.0 standard drinks   Drug use: No   Sexual activity: Not on file  Other Topics Concern   Not on file  Social History Narrative   Patient lives at home alone she is single.   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   Right handed.   Caffeine one cup of coffee daily.   Social Determinants of Health   Financial Resource Strain: Not on file  Food Insecurity: Not on file  Transportation Needs: Not on file  Physical Activity: Not on file  Stress: Not on file  Social Connections: Not on file  Intimate Partner Violence: Not on file     REVIEW OF SYSTEMS:   '[X]'$  denotes positive finding, '[ ]'$  denotes negative finding Cardiac  Comments:  Chest pain or chest pressure:    Shortness of breath upon exertion:    Short of breath when lying flat:    Irregular heart rhythm:        Vascular    Pain in calf, thigh, or hip brought on by ambulation:    Pain in feet at night that wakes you up from your sleep:     Blood clot in your veins:    Leg swelling:         Pulmonary    Oxygen at  home:    Productive cough:     Wheezing:          Neurologic    Sudden weakness in arms or legs:     Sudden numbness in arms or legs:     Sudden onset of difficulty speaking or slurred speech:    Temporary loss of vision in one eye:     Problems with dizziness:         Gastrointestinal    Blood in stool:     Vomited blood:         Genitourinary    Burning when urinating:     Blood in urine:        Psychiatric    Major depression:         Hematologic    Bleeding problems:    Problems with blood clotting too easily:        Skin    Rashes or ulcers:        Constitutional    Fever or chills:      PHYSICAL EXAMINATION:  Today's Vitals   08/19/20 0829 08/19/20 0832  BP: (!) 150/80 (!) 141/78  Pulse: 94 90  Temp: 98.5 F (36.9 C)   TempSrc: Oral   SpO2: 97% 97%  Weight: 182 lb (82.6 kg)   Height: '5\' 6"'$  (1.676 m)    Body mass index is 29.38 kg/m.   General:  WDWN in NAD; vital signs documented above Gait: Not observed HENT: WNL, normocephalic Pulmonary: normal non-labored breathing Cardiac: regular HR, without carotid bruits Skin: with rashes Vascular Exam/Pulses:  Right Left  Radial 1+ (weak) 1+ (weak)  DP 1+ (weak) 1+ (weak)  PT Unable to palpate Unable to palpate   Extremities: without ischemic changes, without Gangrene , without cellulitis; without open wounds Musculoskeletal: no muscle wasting or atrophy  Neurologic: A&O X 3; moving all extremities equally; speech is fluent/normal Psychiatric:  The pt has Normal affect.   Non-Invasive Vascular Imaging:   Carotid Duplex on 08/19/2020: Right:  1-39% ICA stenosis Left:  1-39% ICA stenosis   Previous Carotid duplex on 12/18/2018: Right: 1-39% ICA stenosis Left:   1-39% ICA stenosis    ASSESSMENT/PLAN:: 65 y.o. female here for follow up carotid artery stenosis and is s/p right CEA for asymptomatic carotid artery stenosis on 02/01/2012 by Dr. Trula Slade.  -duplex today reveals 1-39% bilateral ICA stenosis and she remains asymptomatic -discussed s/s  of stroke with pt and she understands should she develop any of these sx, she will go to the nearest ER or call 911. -pt will f/u in 2 years with carotid duplex -pt will call sooner should they have any issues. -continue statin and I recommended her taking daily asa or at least every other day.  Leontine Locket, [facility] [phone]  Clinic MD:  Oneida Alar

## 2020-08-24 ENCOUNTER — Other Ambulatory Visit: Payer: Self-pay

## 2020-08-24 ENCOUNTER — Encounter (HOSPITAL_COMMUNITY)
Admission: RE | Disposition: A | Discharge status: Home / Self Care | Payer: Self-pay | Source: Home / Self Care | Attending: General Surgery

## 2020-08-24 ENCOUNTER — Ambulatory Visit (HOSPITAL_COMMUNITY)
Admission: RE | Admit: 2020-08-24 | Discharge: 2020-08-24 | Disposition: A | Discharge status: Home / Self Care | Payer: Medicare PPO | Attending: General Surgery | Admitting: General Surgery

## 2020-08-24 ENCOUNTER — Encounter (HOSPITAL_COMMUNITY): Payer: Self-pay | Admitting: General Surgery

## 2020-08-24 DIAGNOSIS — Z888 Allergy status to other drugs, medicaments and biological substances status: Secondary | ICD-10-CM | POA: Diagnosis not present

## 2020-08-24 DIAGNOSIS — Z7984 Long term (current) use of oral hypoglycemic drugs: Secondary | ICD-10-CM | POA: Insufficient documentation

## 2020-08-24 DIAGNOSIS — D122 Benign neoplasm of ascending colon: Secondary | ICD-10-CM

## 2020-08-24 DIAGNOSIS — K5750 Diverticulosis of both small and large intestine without perforation or abscess without bleeding: Secondary | ICD-10-CM

## 2020-08-24 DIAGNOSIS — Z79899 Other long term (current) drug therapy: Secondary | ICD-10-CM | POA: Diagnosis not present

## 2020-08-24 DIAGNOSIS — Z1211 Encounter for screening for malignant neoplasm of colon: Secondary | ICD-10-CM

## 2020-08-24 DIAGNOSIS — Z833 Family history of diabetes mellitus: Secondary | ICD-10-CM | POA: Insufficient documentation

## 2020-08-24 DIAGNOSIS — Z880 Allergy status to penicillin: Secondary | ICD-10-CM | POA: Insufficient documentation

## 2020-08-24 DIAGNOSIS — K5730 Diverticulosis of large intestine without perforation or abscess without bleeding: Secondary | ICD-10-CM | POA: Insufficient documentation

## 2020-08-24 DIAGNOSIS — Z87891 Personal history of nicotine dependence: Secondary | ICD-10-CM | POA: Diagnosis not present

## 2020-08-24 HISTORY — PX: COLONOSCOPY: SHX5424

## 2020-08-24 HISTORY — PX: POLYPECTOMY: SHX5525

## 2020-08-24 LAB — GLUCOSE, CAPILLARY: Glucose-Capillary: 183 mg/dL — ABNORMAL HIGH (ref 70–99)

## 2020-08-24 SURGERY — COLONOSCOPY
Anesthesia: Moderate Sedation

## 2020-08-24 MED ORDER — SODIUM CHLORIDE 0.9 % IV SOLN: INTRAVENOUS | Status: DC

## 2020-08-24 MED ORDER — STERILE WATER FOR IRRIGATION IR SOLN
Status: DC | PRN
  Administered 2020-08-24: 1.5 mL

## 2020-08-24 MED ORDER — MEPERIDINE HCL 50 MG/ML IJ SOLN
INTRAMUSCULAR | Status: AC
  Filled 2020-08-24: qty 1

## 2020-08-24 MED ORDER — MIDAZOLAM HCL 5 MG/5ML IJ SOLN
INTRAMUSCULAR | Status: DC | PRN
  Administered 2020-08-24: 3 mg via INTRAVENOUS
  Administered 2020-08-24: 1 mg via INTRAVENOUS

## 2020-08-24 MED ORDER — MEPERIDINE HCL 50 MG/ML IJ SOLN
INTRAMUSCULAR | Status: DC | PRN
  Administered 2020-08-24: 50 mg via INTRAVENOUS

## 2020-08-24 MED ORDER — MIDAZOLAM HCL 5 MG/5ML IJ SOLN
INTRAMUSCULAR | Status: AC
  Filled 2020-08-24: qty 10

## 2020-08-24 NOTE — Interval H&P Note (Signed)
History and Physical Interval Note:  08/24/2020 7:25 AM  Rebecca Mathis  has presented today for surgery, with the diagnosis of Screening.  The various methods of treatment have been discussed with the patient and family. After consideration of risks, benefits and other options for treatment, the patient has consented to  Procedure(s): COLONOSCOPY (N/A) as a surgical intervention.  The patient's history has been reviewed, patient examined, no change in status, stable for surgery.  I have reviewed the patient's chart and labs.  Questions were answered to the patient's satisfaction.     Aviva Signs

## 2020-08-24 NOTE — Op Note (Signed)
[facility] Patient Name: Rebecca Mathis Procedure Date: 08/24/2020 7:05 AM MRN: [id number] Date of Birth: 1955/10/17 Attending MD: Aviva Signs , MD CSN: [id number] Age: 65 Admit Type: Outpatient Procedure:                Colonoscopy Indications:              Screening for colorectal malignant neoplasm Providers:                Aviva Signs, MD, Charlsie Quest. Theda Sers RN, RN, Aram Candela Referring MD:              Medicines:                Midazolam 4 mg IV, Meperidine 50 mg IV Complications:            No immediate complications. Estimated Blood Loss:     Estimated blood loss: none. Procedure:                Pre-Anesthesia Assessment:                           - Prior to the procedure, a History and Physical                            was performed, and patient medications and                            allergies were reviewed. The patient is competent.                            The risks and benefits of the procedure and the                            sedation options and risks were discussed with the                            patient. All questions were answered and informed                            consent was obtained. Patient identification and                            proposed procedure were verified by the physician,                            the nurse and the technician in the procedure room.                            Mental Status Examination: alert and oriented.                            Airway Examination: normal oropharyngeal airway and  neck mobility. Respiratory Examination: clear to                            auscultation. CV Examination: RRR, no murmurs, no                            S3 or S4. Prophylactic Antibiotics: The patient                            does not require prophylactic antibiotics. Prior                            Anticoagulants: The patient has taken no previous                             anticoagulant or antiplatelet agents. ASA Grade                            Assessment: II - A patient with mild systemic                            disease. After reviewing the risks and benefits,                            the patient was deemed in satisfactory condition to                            undergo the procedure. The anesthesia plan was to                            use moderate sedation / analgesia (conscious                            sedation). Immediately prior to administration of                            medications, the patient was re-assessed for                            adequacy to receive sedatives. The heart rate,                            respiratory rate, oxygen saturations, blood                            pressure, adequacy of pulmonary ventilation, and                            response to care were monitored throughout the                            procedure. The physical status of the patient was  re-assessed after the procedure.                           After obtaining informed consent, the colonoscope                            was passed under direct vision. Throughout the                            procedure, the patient's blood pressure, pulse, and                            oxygen saturations were monitored continuously. The                            [id number]) scope was introduced through the                            anus and advanced to the the cecum, identified by                            the appendiceal orifice, ileocecal valve and                            palpation. No anatomical landmarks were                            photographed. The total duration of the procedure                            was 14 minutes. The quality of the bowel                            preparation was adequate. Scope In: 7:30:48 AM Scope Out: 7:44:26 AM Scope Withdrawal Time: 0 hours 6 minutes 45 seconds  Total Procedure  Duration: 0 hours 13 minutes 38 seconds  Findings:      The perianal and digital rectal examinations were normal.      A few small-mouthed diverticula were found in the descending colon.       Estimated blood loss: none.      A 2 mm polyp was found in the distal ascending colon. The polyp was       sessile. The polyp was removed with a hot snare. Resection and retrieval       were complete. Estimated blood loss: none.      The exam was otherwise without abnormality on direct and retroflexion       views. Impression:               - Diverticulosis in the descending colon.                           - The entire examined colon is normal.                           - One 2 mm polyp in the distal ascending colon,  removed with a hot snare. Resected and retrieved.                           - The examination was otherwise normal on direct                            and retroflexion views. Moderate Sedation:      Moderate (conscious) sedation was administered by the endoscopy nurse       and supervised by the endoscopist. The following parameters were       monitored: oxygen saturation, heart rate, blood pressure, and response       to care. Recommendation:           - Patient has a contact number available for                            emergencies. The signs and symptoms of potential                            delayed complications were discussed with the                            patient. Return to normal activities tomorrow.                            Written discharge instructions were provided to the                            patient.                           - Written discharge instructions were provided to                            the patient.                           - The signs and symptoms of potential delayed                            complications were discussed with the patient.                           - Patient has a contact number available for                             emergencies.                           - Return to normal activities tomorrow.                           - Resume previous diet.                           - Continue present medications.                           -  Repeat colonoscopy is recommended for                            surveillance. The colonoscopy date will be                            determined after pathology results from today's                            exam become available for review. Procedure Code(s):        --- Professional ---                           [phone], Colonoscopy, flexible; with removal of                            tumor(s), polyp(s), or other lesion(s) by snare                            technique Diagnosis Code(s):        --- Professional ---                           Z12.11, Encounter for screening for malignant                            neoplasm of colon                           K63.5, Polyp of colon                           K57.30, Diverticulosis of large intestine without                            perforation or abscess without bleeding CPT copyright 2019 [employer]. All rights reserved. The codes documented in this report are preliminary and upon coder review may  be revised to meet current compliance requirements. Aviva Signs, MD Aviva Signs, MD 08/24/2020 8:27:48 AM This report has been signed electronically. Number of Addenda: 0

## 2020-08-25 LAB — SURGICAL PATHOLOGY

## 2020-08-26 DIAGNOSIS — M531 Cervicobrachial syndrome: Secondary | ICD-10-CM | POA: Diagnosis not present

## 2020-08-26 DIAGNOSIS — S233XXA Sprain of ligaments of thoracic spine, initial encounter: Secondary | ICD-10-CM | POA: Diagnosis not present

## 2020-08-26 DIAGNOSIS — M9902 Segmental and somatic dysfunction of thoracic region: Secondary | ICD-10-CM | POA: Diagnosis not present

## 2020-08-26 DIAGNOSIS — S134XXA Sprain of ligaments of cervical spine, initial encounter: Secondary | ICD-10-CM | POA: Diagnosis not present

## 2020-08-26 DIAGNOSIS — M9901 Segmental and somatic dysfunction of cervical region: Secondary | ICD-10-CM | POA: Diagnosis not present

## 2020-09-01 ENCOUNTER — Encounter (HOSPITAL_COMMUNITY): Payer: Self-pay | Admitting: General Surgery

## 2020-09-03 ENCOUNTER — Telehealth (INDEPENDENT_AMBULATORY_CARE_PROVIDER_SITE_OTHER): Payer: Medicare PPO | Admitting: General Surgery

## 2020-09-03 DIAGNOSIS — Z09 Encounter for follow-up examination after completed treatment for conditions other than malignant neoplasm: Secondary | ICD-10-CM

## 2020-09-03 NOTE — Telephone Encounter (Signed)
Results called into patient.  She had a tubular adenoma without evidence of dysplasia or malignancy.  I told her to return in 5 years for follow-up colonoscopy. As this was a part of the global surgical fee, this was not a billable visit.  Total telephone time was 2 minutes.

## 2020-09-23 DIAGNOSIS — E7849 Other hyperlipidemia: Secondary | ICD-10-CM | POA: Diagnosis not present

## 2020-09-23 DIAGNOSIS — I10 Essential (primary) hypertension: Secondary | ICD-10-CM | POA: Diagnosis not present

## 2020-09-23 DIAGNOSIS — E6609 Other obesity due to excess calories: Secondary | ICD-10-CM | POA: Diagnosis not present

## 2020-09-23 DIAGNOSIS — E1165 Type 2 diabetes mellitus with hyperglycemia: Secondary | ICD-10-CM | POA: Diagnosis not present

## 2020-09-23 DIAGNOSIS — Z6831 Body mass index (BMI) 31.0-31.9, adult: Secondary | ICD-10-CM | POA: Diagnosis not present

## 2020-09-27 DIAGNOSIS — S134XXA Sprain of ligaments of cervical spine, initial encounter: Secondary | ICD-10-CM | POA: Diagnosis not present

## 2020-09-27 DIAGNOSIS — M9902 Segmental and somatic dysfunction of thoracic region: Secondary | ICD-10-CM | POA: Diagnosis not present

## 2020-09-27 DIAGNOSIS — M9901 Segmental and somatic dysfunction of cervical region: Secondary | ICD-10-CM | POA: Diagnosis not present

## 2020-09-27 DIAGNOSIS — M531 Cervicobrachial syndrome: Secondary | ICD-10-CM | POA: Diagnosis not present

## 2020-09-27 DIAGNOSIS — S233XXA Sprain of ligaments of thoracic spine, initial encounter: Secondary | ICD-10-CM | POA: Diagnosis not present

## 2020-11-01 DIAGNOSIS — S134XXA Sprain of ligaments of cervical spine, initial encounter: Secondary | ICD-10-CM | POA: Diagnosis not present

## 2020-11-01 DIAGNOSIS — M531 Cervicobrachial syndrome: Secondary | ICD-10-CM | POA: Diagnosis not present

## 2020-11-01 DIAGNOSIS — M9901 Segmental and somatic dysfunction of cervical region: Secondary | ICD-10-CM | POA: Diagnosis not present

## 2020-11-01 DIAGNOSIS — M9902 Segmental and somatic dysfunction of thoracic region: Secondary | ICD-10-CM | POA: Diagnosis not present

## 2020-11-01 DIAGNOSIS — S233XXA Sprain of ligaments of thoracic spine, initial encounter: Secondary | ICD-10-CM | POA: Diagnosis not present

## 2020-11-10 ENCOUNTER — Ambulatory Visit (HOSPITAL_COMMUNITY): Payer: Medicare PPO | Attending: Family Medicine | Admitting: Physical Therapy

## 2020-11-10 ENCOUNTER — Other Ambulatory Visit: Payer: Self-pay

## 2020-11-10 ENCOUNTER — Encounter (HOSPITAL_COMMUNITY): Payer: Self-pay | Admitting: Physical Therapy

## 2020-11-10 DIAGNOSIS — R42 Dizziness and giddiness: Secondary | ICD-10-CM | POA: Insufficient documentation

## 2020-11-10 NOTE — Therapy (Signed)
[place], [place], [address] Phone: [phone]   Fax:  [phone]  Physical Therapy Evaluation  Patient Details  Name: Rebecca Mathis MRN: [id number] Date of Birth: 07-14-1955 Referring Provider (PT): Delman Cheadle Pa-C   Encounter Date: 11/10/2020   PT End of Session - 11/10/20 1516     Visit Number 1    Number of Visits 4    Date for PT Re-Evaluation 12/08/20    Authorization Type [employer]    Authorization Time Period Check auth    PT Start Time 9381    PT Stop Time 0175    PT Time Calculation (min) 51 min    Activity Tolerance Patient tolerated treatment well    Behavior During Therapy [facility] for tasks assessed/performed             Past Medical History:  Diagnosis Date   Anxiety    Carotid artery occlusion    Diabetes mellitus without complication ([place])    during pregnancy   Dyslipidemia    GERD (gastroesophageal reflux disease)    INDIGESTION W/ SOME FOODS     Heart murmur    Hyperlipidemia    Hypertension     Past Surgical History:  Procedure Laterality Date   caratoid artery  01/14   CAROTID ENDARTERECTOMY Right 02-01-12   cea   CESAREAN SECTION  1984   COLONOSCOPY N/A 08/24/2020   Procedure: COLONOSCOPY;  Surgeon: Aviva Signs, MD;  [facility];  Service: Gastroenterology;  Laterality: N/A;   ENDARTERECTOMY  02/01/2012   Procedure: ENDARTERECTOMY CAROTID;  Surgeon: Serafina Mitchell, MD;  Location: [facility] OR;  Service: Vascular;  Laterality: Right;   NM MYOCAR [place]  11/01/2011   The post stress myocardial perfusion images show a normal pattern of perfusion in all regions. The post stress left ventricle is normal in size. The post-stress ejection fraction is 84%. No significant wall motion abnormalities noted.   PATCH ANGIOPLASTY  02/01/2012   Procedure: PATCH ANGIOPLASTY;  Surgeon: Serafina Mitchell, MD;  Location: [place];  Service: Vascular;  Laterality:  Right;  Using 1 x 6 cm vascu- guard patch    POLYPECTOMY  08/24/2020   Procedure: POLYPECTOMY;  Surgeon: Aviva Signs, MD;  [facility];  Service: Gastroenterology;;    There were no vitals filed for this visit.    Subjective Assessment - 11/10/20 1435     Subjective Patient presents to therapy with complaint of vertigo. She believes it is positional. She had an episode last week where she bent down which caused her to feel very dizzy, she became nauseous and threw up. This has been ongoing off and on for several years.    Limitations Walking;House hold activities;Lifting    Patient Stated Goals Learn how to help myself at home                [facility] PT Assessment - 11/10/20 0001       Assessment   Medical Diagnosis Vertigo    Referring Provider (PT) Delman Cheadle Pa-C    Prior Therapy No      Precautions   Precautions None      Restrictions   Weight Bearing Restrictions No      Balance Screen   Has the patient fallen in the past 6 months No      [place] residence      Prior Function  Level of Independence Independent      Cognition   Overall Cognitive Status Within Functional Limits for tasks assessed      Observation/Other Assessments   Focus on Therapeutic Outcomes (FOTO)  48% function                    Vestibular Assessment - 11/10/20 0001       Oculomotor Exam   Ocular ROM WFL    Smooth Pursuits Comment   Overshoots in LT field tracking vertically     Positional Testing   Dix-Hallpike Dix-Hallpike Right;Dix-Hallpike Left   negative bilateral but patient very gaurded and transitions slowly     Dix-Hallpike Right   Dix-Hallpike Right Symptoms No nystagmus      Dix-Hallpike Left   Dix-Hallpike Left Symptoms No nystagmus                Objective measurements completed on examination: See above findings.                PT Education - 11/10/20 1436     Education  Details on evalution findings and POC    Person(s) Educated Patient    Methods Explanation    Comprehension Verbalized understanding              PT Short Term Goals - 11/10/20 1615       PT SHORT TERM GOAL #1   Title Patient will report at least 50% overall improvement in symptoms to indicate improvement in ability to perform ADLs.    Time 2    Period Weeks    Status New    Target Date 11/24/20               PT Long Term Goals - 11/10/20 1616       PT LONG TERM GOAL #1   Title Patient will report full resolution of positional vertigo in and out of bed for improved ability to perform ADLs.    Time 4    Period Weeks    Status New    Target Date 12/08/20      PT LONG TERM GOAL #2   Title Patient will improve FOTO score to predicted value to indicate improvement in functional outcomes    Time 4    Period Weeks    Status New    Target Date 12/08/20                    Plan - 11/10/20 1609     Clinical Impression Statement Patient is a 65 yo female who presents to therapy with complaint of vertigo and vestibular issues which are negatively impacting functional ability. Patient showing negative Dixhallpike, but has some positional sensitivities which increase guarding and dizziness sensation. Patient would likely benefit from further vestibular assessment, as well as balance and habituation exercises to reduce symptoms and improve overall functional level, and ability to perform ADLs.    Examination-Activity Limitations Bed Mobility;Bend;Locomotion Level;Transfers    Examination-Participation Restrictions Laundry;Cleaning;Yard Work;Community Activity    Stability/Clinical Decision Making Stable/Uncomplicated    Clinical Decision Making Low    Rehab Potential Good    PT Frequency 1x / week    PT Duration 4 weeks    PT Treatment/Interventions ADLs/Self Care Home Management;Biofeedback;Canalith Repostioning;Electrical Stimulation;DME Instruction;Contrast  Bath;Gait training;Stair training;Moist Heat;Traction;Ultrasound;Parrafin;Fluidtherapy;Therapeutic activities;Functional mobility training;Cryotherapy;Neuromuscular re-education;Patient/family education;Orthotic Fit/Training;Compression bandaging;Scar mobilization;Therapeutic exercise;Balance training;Taping;Joint Manipulations;Vasopneumatic Device;Vestibular;Visual/perceptual remediation/compensation;Passive range of motion;Dry needling;Manual techniques;Manual lymph drainage;Energy conservation;Splinting;Spinal Manipulations;Iontophoresis 4mg /ml Dexamethasone    PT Next  Visit Plan Continue vestibular assessment, possibly retest LT Eppley (patient very gaurded and transitions very slowly). Horizontal canal, positional sensitivity testing (bend forward, sit/ LT side lying/ RT sidelying). Habituation exercises as indicated    PT Home Exercise Plan As indicated    Consulted and Agree with Plan of Care Patient             Patient will benefit from skilled therapeutic intervention in order to improve the following deficits and impairments:  Dizziness, Decreased range of motion, Decreased activity tolerance, Decreased balance  Visit Diagnosis: Dizziness and giddiness     Problem List Patient Active Problem List   Diagnosis Date Noted   Special screening for malignant neoplasms, colon    Adenomatous polyp of ascending colon    Diverticul disease small and large intestine, no perforati or abscess    Aftercare following surgery of the circulatory system, NEC 02/19/2012   Occlusion and stenosis of carotid artery without mention of cerebral infarction 12/18/2011   4:28 PM, 11/10/20 Josue Hector PT DPT  Physical Therapist with [facility]  [phone] [place] [address] [place], [place], [address] Phone: [phone]   Fax:  [phone]  Name: Rebecca Mathis MRN: [id number] Date of Birth: 01/24/1955

## 2020-11-17 ENCOUNTER — Other Ambulatory Visit: Payer: Self-pay

## 2020-11-17 ENCOUNTER — Encounter (HOSPITAL_COMMUNITY): Payer: Self-pay | Admitting: Physical Therapy

## 2020-11-17 ENCOUNTER — Ambulatory Visit (HOSPITAL_COMMUNITY): Payer: Medicare PPO | Admitting: Physical Therapy

## 2020-11-17 DIAGNOSIS — R42 Dizziness and giddiness: Secondary | ICD-10-CM

## 2020-11-17 NOTE — Therapy (Signed)
[place], [place], [address] Phone: [phone]   Fax:  [phone]  Physical Therapy Treatment  Patient Details  Name: Rebecca Mathis MRN: [id number] Date of Birth: 09-11-1955 Referring Provider (PT): Delman Cheadle Pa-C   Encounter Date: 11/17/2020   PT End of Session - 11/17/20 1422     Visit Number 2    Number of Visits 4    Date for PT Re-Evaluation 12/08/20    Authorization Type [employer]    Authorization Time Period 4 visits 11/2-11/30/22    Authorization - Visit Number 1    Authorization - Number of Visits 4    PT Start Time 1350    PT Stop Time 1430    PT Time Calculation (min) 40 min    Activity Tolerance Patient tolerated treatment well    Behavior During Therapy [facility] for tasks assessed/performed             Past Medical History:  Diagnosis Date   Anxiety    Carotid artery occlusion    Diabetes mellitus without complication ([place])    during pregnancy   Dyslipidemia    GERD (gastroesophageal reflux disease)    INDIGESTION W/ SOME FOODS     Heart murmur    Hyperlipidemia    Hypertension     Past Surgical History:  Procedure Laterality Date   caratoid artery  01/14   CAROTID ENDARTERECTOMY Right 02-01-12   cea   CESAREAN SECTION  1984   COLONOSCOPY N/A 08/24/2020   Procedure: COLONOSCOPY;  Surgeon: Aviva Signs, MD;  [facility];  Service: Gastroenterology;  Laterality: N/A;   ENDARTERECTOMY  02/01/2012   Procedure: ENDARTERECTOMY CAROTID;  Surgeon: Serafina Mitchell, MD;  Location: [facility] OR;  Service: Vascular;  Laterality: Right;   NM MYOCAR [place]  11/01/2011   The post stress myocardial perfusion images show a normal pattern of perfusion in all regions. The post stress left ventricle is normal in size. The post-stress ejection fraction is 84%. No significant wall motion abnormalities noted.   PATCH ANGIOPLASTY  02/01/2012   Procedure: PATCH  ANGIOPLASTY;  Surgeon: Serafina Mitchell, MD;  Location: [place];  Service: Vascular;  Laterality: Right;  Using 1 x 6 cm vascu- guard patch    POLYPECTOMY  08/24/2020   Procedure: POLYPECTOMY;  Surgeon: Aviva Signs, MD;  [facility];  Service: Gastroenterology;;    There were no vitals filed for this visit.   Subjective Assessment - 11/17/20 1355     Subjective Patient says she is doing better. She has not had any episodes since last week. She would like to improve her ability to perform self Eppley.    Limitations Walking;House hold activities;Lifting    Patient Stated Goals Learn how to help myself at home                     Vestibular Assessment - 11/17/20 0001       Positional Sensitivities   Sit to Supine No dizziness    Supine to Left Side No dizziness    Supine to Right Side No dizziness    Supine to Sitting No dizziness    Right Hallpike No dizziness    Head Turning x 5 No dizziness    Head Nodding x 5 No dizziness    Rolling Right No dizziness    Rolling Left No dizziness  PT Short Term Goals - 11/10/20 1615       PT SHORT TERM GOAL #1   Title Patient will report at least 50% overall improvement in symptoms to indicate improvement in ability to perform ADLs.    Time 2    Period Weeks    Status New    Target Date 11/24/20               PT Long Term Goals - 11/10/20 1616       PT LONG TERM GOAL #1   Title Patient will report full resolution of positional vertigo in and out of bed for improved ability to perform ADLs.    Time 4    Period Weeks    Status New    Target Date 12/08/20      PT LONG TERM GOAL #2   Title Patient will improve FOTO score to predicted value to indicate improvement in functional outcomes    Time 4    Period Weeks    Status New    Target Date 12/08/20                   Plan - 11/17/20 1754     Clinical Impression Statement Performed thorough assessment of  positional sensitivities. Patient negative on all accounts. Negative Hallpike today. Discussed differential diagnosis for dizziness symptoms unrelated to positional changes. Reviewed and educated patient on Epley maneuver and how best to perform independently. Answered all patient questions, encouraged patient to follow up with therapy services as needed in 2 weeks. DC if no episodes at that point.    Examination-Activity Limitations Bed Mobility;Bend;Locomotion Level;Transfers    Examination-Participation Restrictions Laundry;Cleaning;Yard Work;Community Activity    Stability/Clinical Decision Making Stable/Uncomplicated    Rehab Potential Good    PT Frequency 1x / week    PT Duration 4 weeks    PT Treatment/Interventions ADLs/Self Care Home Management;Biofeedback;Canalith Repostioning;Electrical Stimulation;DME Instruction;Contrast Bath;Gait training;Stair training;Moist Heat;Traction;Ultrasound;Parrafin;Fluidtherapy;Therapeutic activities;Functional mobility training;Cryotherapy;Neuromuscular re-education;Patient/family education;Orthotic Fit/Training;Compression bandaging;Scar mobilization;Therapeutic exercise;Balance training;Taping;Joint Manipulations;Vasopneumatic Device;Vestibular;Visual/perceptual remediation/compensation;Passive range of motion;Dry needling;Manual techniques;Manual lymph drainage;Energy conservation;Splinting;Spinal Manipulations;Iontophoresis 4mg /ml Dexamethasone    PT Next Visit Plan Assess as indicated. Follow up in 2 weeks, DC if no episodes    PT Home Exercise Plan As indicated    Consulted and Agree with Plan of Care Patient             Patient will benefit from skilled therapeutic intervention in order to improve the following deficits and impairments:  Dizziness, Decreased range of motion, Decreased activity tolerance, Decreased balance  Visit Diagnosis: Dizziness and giddiness     Problem List Patient Active Problem List   Diagnosis Date Noted    Special screening for malignant neoplasms, colon    Adenomatous polyp of ascending colon    Diverticul disease small and large intestine, no perforati or abscess    Aftercare following surgery of the circulatory system, NEC 02/19/2012   Occlusion and stenosis of carotid artery without mention of cerebral infarction 12/18/2011   5:57 PM, 11/17/20 Josue Hector PT DPT  Physical Therapist with [facility]  [phone] [place] [address] [place], [place], [address] Phone: [phone]   Fax:  [phone]  Name: JODIE CAVEY MRN: [id number] Date of Birth: [birth year]

## 2020-11-18 ENCOUNTER — Ambulatory Visit (INDEPENDENT_AMBULATORY_CARE_PROVIDER_SITE_OTHER): Payer: BC Managed Care – PPO | Admitting: Gastroenterology

## 2020-11-24 ENCOUNTER — Encounter (HOSPITAL_COMMUNITY): Payer: Medicare PPO | Admitting: Physical Therapy

## 2020-11-29 ENCOUNTER — Ambulatory Visit (HOSPITAL_COMMUNITY): Payer: Medicare PPO | Admitting: Physical Therapy

## 2020-11-30 DIAGNOSIS — M9902 Segmental and somatic dysfunction of thoracic region: Secondary | ICD-10-CM | POA: Diagnosis not present

## 2020-11-30 DIAGNOSIS — S134XXA Sprain of ligaments of cervical spine, initial encounter: Secondary | ICD-10-CM | POA: Diagnosis not present

## 2020-11-30 DIAGNOSIS — S233XXA Sprain of ligaments of thoracic spine, initial encounter: Secondary | ICD-10-CM | POA: Diagnosis not present

## 2020-11-30 DIAGNOSIS — M9901 Segmental and somatic dysfunction of cervical region: Secondary | ICD-10-CM | POA: Diagnosis not present

## 2020-11-30 DIAGNOSIS — M531 Cervicobrachial syndrome: Secondary | ICD-10-CM | POA: Diagnosis not present

## 2020-12-06 ENCOUNTER — Ambulatory Visit (HOSPITAL_COMMUNITY): Payer: Medicare PPO | Admitting: Physical Therapy

## 2020-12-28 DIAGNOSIS — S134XXA Sprain of ligaments of cervical spine, initial encounter: Secondary | ICD-10-CM | POA: Diagnosis not present

## 2020-12-28 DIAGNOSIS — M9902 Segmental and somatic dysfunction of thoracic region: Secondary | ICD-10-CM | POA: Diagnosis not present

## 2020-12-28 DIAGNOSIS — S233XXA Sprain of ligaments of thoracic spine, initial encounter: Secondary | ICD-10-CM | POA: Diagnosis not present

## 2020-12-28 DIAGNOSIS — M531 Cervicobrachial syndrome: Secondary | ICD-10-CM | POA: Diagnosis not present

## 2020-12-28 DIAGNOSIS — M9901 Segmental and somatic dysfunction of cervical region: Secondary | ICD-10-CM | POA: Diagnosis not present

## 2021-01-08 DIAGNOSIS — E119 Type 2 diabetes mellitus without complications: Secondary | ICD-10-CM | POA: Diagnosis not present

## 2021-01-13 DIAGNOSIS — E1165 Type 2 diabetes mellitus with hyperglycemia: Secondary | ICD-10-CM | POA: Diagnosis not present

## 2021-01-13 DIAGNOSIS — I10 Essential (primary) hypertension: Secondary | ICD-10-CM | POA: Diagnosis not present

## 2021-01-13 DIAGNOSIS — Z6831 Body mass index (BMI) 31.0-31.9, adult: Secondary | ICD-10-CM | POA: Diagnosis not present

## 2021-01-13 DIAGNOSIS — E6609 Other obesity due to excess calories: Secondary | ICD-10-CM | POA: Diagnosis not present

## 2021-01-13 DIAGNOSIS — E7849 Other hyperlipidemia: Secondary | ICD-10-CM | POA: Diagnosis not present

## 2021-01-13 DIAGNOSIS — E782 Mixed hyperlipidemia: Secondary | ICD-10-CM | POA: Diagnosis not present

## 2021-01-18 ENCOUNTER — Encounter (HOSPITAL_COMMUNITY): Payer: Self-pay | Admitting: Physical Therapy

## 2021-01-18 NOTE — Therapy (Signed)
[place], [place], [address] Phone: [phone]   Fax:  [phone]  Patient Details  Name: Rebecca Mathis MRN: [id number] Date of Birth: 1955/06/06 Referring Provider:  No ref. provider found  Encounter Date: 01/18/2021  PHYSICAL THERAPY DISCHARGE SUMMARY  Visits from Start of Care: 2  Current functional level related to goals / functional outcomes: NA   Remaining deficits: NA   Education / Equipment: Patient DC per self request due to improved function    Patient agrees to discharge. Patient goals were partially met. Patient is being discharged due to being pleased with the current functional level.  9:32 AM, 01/18/21 Josue Hector PT DPT  Physical Therapist with [facility]  [phone] [place] [address] [place], [place], [address] Phone: [phone]   Fax:  [phone]

## 2021-01-27 DIAGNOSIS — M9902 Segmental and somatic dysfunction of thoracic region: Secondary | ICD-10-CM | POA: Diagnosis not present

## 2021-01-27 DIAGNOSIS — M531 Cervicobrachial syndrome: Secondary | ICD-10-CM | POA: Diagnosis not present

## 2021-01-27 DIAGNOSIS — S233XXA Sprain of ligaments of thoracic spine, initial encounter: Secondary | ICD-10-CM | POA: Diagnosis not present

## 2021-01-27 DIAGNOSIS — M9901 Segmental and somatic dysfunction of cervical region: Secondary | ICD-10-CM | POA: Diagnosis not present

## 2021-01-27 DIAGNOSIS — S134XXA Sprain of ligaments of cervical spine, initial encounter: Secondary | ICD-10-CM | POA: Diagnosis not present

## 2021-03-04 DIAGNOSIS — S76311A Strain of muscle, fascia and tendon of the posterior muscle group at thigh level, right thigh, initial encounter: Secondary | ICD-10-CM | POA: Diagnosis not present

## 2021-03-04 DIAGNOSIS — I10 Essential (primary) hypertension: Secondary | ICD-10-CM | POA: Diagnosis not present

## 2021-03-04 DIAGNOSIS — G43109 Migraine with aura, not intractable, without status migrainosus: Secondary | ICD-10-CM | POA: Diagnosis not present

## 2021-03-04 DIAGNOSIS — E118 Type 2 diabetes mellitus with unspecified complications: Secondary | ICD-10-CM | POA: Diagnosis not present

## 2021-03-04 DIAGNOSIS — Z6831 Body mass index (BMI) 31.0-31.9, adult: Secondary | ICD-10-CM | POA: Diagnosis not present

## 2021-04-28 DIAGNOSIS — I10 Essential (primary) hypertension: Secondary | ICD-10-CM | POA: Diagnosis not present

## 2021-04-28 DIAGNOSIS — Z6831 Body mass index (BMI) 31.0-31.9, adult: Secondary | ICD-10-CM | POA: Diagnosis not present

## 2021-04-28 DIAGNOSIS — E1165 Type 2 diabetes mellitus with hyperglycemia: Secondary | ICD-10-CM | POA: Diagnosis not present

## 2021-04-28 DIAGNOSIS — E6609 Other obesity due to excess calories: Secondary | ICD-10-CM | POA: Diagnosis not present

## 2021-04-28 DIAGNOSIS — E782 Mixed hyperlipidemia: Secondary | ICD-10-CM | POA: Diagnosis not present

## 2021-08-18 DIAGNOSIS — Z6829 Body mass index (BMI) 29.0-29.9, adult: Secondary | ICD-10-CM | POA: Diagnosis not present

## 2021-08-18 DIAGNOSIS — E118 Type 2 diabetes mellitus with unspecified complications: Secondary | ICD-10-CM | POA: Diagnosis not present

## 2021-08-18 DIAGNOSIS — Z1331 Encounter for screening for depression: Secondary | ICD-10-CM | POA: Diagnosis not present

## 2021-08-18 DIAGNOSIS — E782 Mixed hyperlipidemia: Secondary | ICD-10-CM | POA: Diagnosis not present

## 2021-08-18 DIAGNOSIS — Z0001 Encounter for general adult medical examination with abnormal findings: Secondary | ICD-10-CM | POA: Diagnosis not present

## 2021-08-18 DIAGNOSIS — I10 Essential (primary) hypertension: Secondary | ICD-10-CM | POA: Diagnosis not present

## 2021-10-05 ENCOUNTER — Ambulatory Visit
Admission: EM | Admit: 2021-10-05 | Discharge: 2021-10-05 | Disposition: A | Discharge status: Home / Self Care | Payer: Medicare PPO | Attending: Family Medicine | Admitting: Family Medicine

## 2021-10-05 DIAGNOSIS — N390 Urinary tract infection, site not specified: Secondary | ICD-10-CM | POA: Diagnosis not present

## 2021-10-05 LAB — POCT URINALYSIS DIP (MANUAL ENTRY)
Glucose, UA: NEGATIVE mg/dL
Nitrite, UA: POSITIVE — AB
Protein Ur, POC: 100 mg/dL — AB
Spec Grav, UA: 1.025 (ref 1.010–1.025)
Urobilinogen, UA: 0.2 E.U./dL
pH, UA: 5.5 (ref 5.0–8.0)

## 2021-10-05 MED ORDER — SULFAMETHOXAZOLE-TRIMETHOPRIM 800-160 MG PO TABS: 1.0000 | ORAL_TABLET | Freq: Two times a day (BID) | ORAL | 0 refills | Status: DC

## 2021-10-05 NOTE — ED Triage Notes (Signed)
Pt reports burning when urinating x 1 week.

## 2021-10-05 NOTE — ED Provider Notes (Signed)
[facility]    CSN: [id number] Arrival date & time: 10/05/21  1458      History   Chief Complaint Chief Complaint  Patient presents with   Dysuria    HPI Rebecca Mathis is a 66 y.o. female.   Patient presenting today with 1 week history of dysuria, darker urine color than usual.  Denies fever, chills, nausea, vomiting, abdominal pain, back pain.  So for not trying anything over-the-counter for symptoms.    Past Medical History:  Diagnosis Date   Anxiety    Carotid artery occlusion    Diabetes mellitus without complication ([place])    during pregnancy   Dyslipidemia    GERD (gastroesophageal reflux disease)    INDIGESTION W/ SOME FOODS     Heart murmur    Hyperlipidemia    Hypertension     Patient Active Problem List   Diagnosis Date Noted   Special screening for malignant neoplasms, colon    Adenomatous polyp of ascending colon    Diverticul disease small and large intestine, no perforati or abscess    Aftercare following surgery of the circulatory system, NEC 02/19/2012   Occlusion and stenosis of carotid artery without mention of cerebral infarction 12/18/2011    Past Surgical History:  Procedure Laterality Date   caratoid artery  01/14   CAROTID ENDARTERECTOMY Right 02-01-12   [place]   COLONOSCOPY N/A 08/24/2020   Procedure: COLONOSCOPY;  Surgeon: Aviva Signs, MD;  [facility];  Service: Gastroenterology;  Laterality: N/A;   ENDARTERECTOMY  02/01/2012   Procedure: ENDARTERECTOMY CAROTID;  Surgeon: Serafina Mitchell, MD;  Location: [facility] OR;  Service: Vascular;  Laterality: Right;   NM MYOCAR [place]  11/01/2011   The post stress myocardial perfusion images show a normal pattern of perfusion in all regions. The post stress left ventricle is normal in size. The post-stress ejection fraction is 84%. No significant wall motion abnormalities noted.   PATCH ANGIOPLASTY  02/01/2012   Procedure: PATCH  ANGIOPLASTY;  Surgeon: Serafina Mitchell, MD;  Location: [place];  Service: Vascular;  Laterality: Right;  Using 1 x 6 cm vascu- guard patch    POLYPECTOMY  08/24/2020   Procedure: POLYPECTOMY;  Surgeon: Aviva Signs, MD;  [facility];  Service: Gastroenterology;;    OB History   No obstetric history on file.      Home Medications    Prior to Admission medications   Medication Sig Start Date End Date Taking? Authorizing Provider  sulfamethoxazole-trimethoprim (BACTRIM DS) 800-160 MG tablet Take 1 tablet by mouth 2 (two) times daily. 10/05/21  Yes Volney American, PA-C  albuterol (VENTOLIN HFA) 108 (90 Base) MCG/ACT inhaler Inhale 2 puffs into the lungs every 6 (six) hours as needed for shortness of breath or wheezing (when around cats). 08/17/20   [provider]  amLODipine (NORVASC) 10 MG tablet Take 10 mg by mouth daily.    [provider]  Cholecalciferol (D3 ADULT PO) Take 2,000 mcg by mouth daily.    [provider]  Coenzyme Q10 (CO Q-10) 400 MG CAPS Take 400 mg by mouth daily.    [provider]  glipiZIDE (GLUCOTROL) 10 MG tablet Take 10 mg by mouth 2 (two) times daily. 06/14/20   [provider]  hydrochlorothiazide (HYDRODIURIL) 25 MG tablet Take 25 mg by mouth daily.  08/24/11   [provider]  Magnesium 500 MG TABS Take 500 mg by  mouth daily.    [provider]  Menaquinone-7 (VITAMIN K2 PO) Take 1 tablet by mouth daily.    [provider]  metFORMIN (GLUCOPHAGE) 500 MG tablet Take 1,000 mg by mouth 2 (two) times daily. 08/04/15   [provider]  Milk Thistle 1000 MG CAPS Take 1,000 mg by mouth daily.    [provider]  NON FORMULARY Pt uses bipap nightly    [provider]  Nutritional Supplements (JUICE PLUS FIBRE PO) Take 3 tablets by mouth 2 (two) times daily.    [provider]  rosuvastatin (CRESTOR) 5 MG tablet Take 5 mg by mouth daily.    [provider]  Semaglutide 14 MG TABS Take 14 mg by mouth daily. 12/16/18   [provider]  vitamin B-12 (CYANOCOBALAMIN) 1000 MCG tablet Take 1,000 mcg by mouth daily.    [provider]    Family History Family History  Problem Relation Age of Onset   Coronary artery disease Mother    Cancer Mother        ovarian   Diabetes Mother    Heart disease Mother    Heart attack Mother    Coronary artery disease Father    Other Father        varicose veins   Heart disease Sister    Hypertension Sister    Heart disease Brother    Diabetes Brother    Restless legs syndrome Sister     Social History Social History   Tobacco Use   Smoking status: Former    Types: Cigarettes    Quit date: 09/18/2011    Years since quitting: 10.0   Smokeless tobacco: Never  Vaping Use   Vaping Use: Never used  Substance Use Topics   Alcohol use: No    Alcohol/week: 0.0 standard drinks of alcohol   Drug use: No     Allergies   Lipitor [atorvastatin], Penicillins, Lidocaine, Restylane-l [sodium hyaluronate & lidocaine], and Latex   Review of Systems Review of Systems Per HPI  Physical Exam Triage Vital Signs ED Triage Vitals [10/05/21 1510]  Enc Vitals Group     BP 121/75     Pulse Rate 83     Resp 18     Temp 98.8 F (37.1 C)     Temp Source Oral     SpO2 95 %     Weight      Height      Head Circumference      Peak Flow      Pain Score 0     Pain Loc      Pain Edu?      Excl. in [place]?    No data found.  Updated Vital Signs BP 121/75 (BP Location: Right Arm)   Pulse 83   Temp 98.8 F (37.1 C) (Oral)   Resp 18   SpO2 95%   Visual Acuity Right Eye Distance:   Left Eye Distance:   Bilateral Distance:    Right Eye Near:   Left Eye Near:    Bilateral Near:     Physical Exam Vitals and nursing note reviewed.  Constitutional:      Appearance: Normal appearance. She is not ill-appearing.  HENT:     Head: Atraumatic.     Mouth/Throat:     Mouth:  Mucous membranes are moist.  Eyes:     Extraocular Movements: Extraocular movements intact.     Conjunctiva/sclera: Conjunctivae normal.  Cardiovascular:  Rate and Rhythm: Normal rate and regular rhythm.     Heart sounds: Normal heart sounds.  Pulmonary:     Effort: Pulmonary effort is normal.     Breath sounds: Normal breath sounds.  Abdominal:     General: Bowel sounds are normal. There is no distension.     Palpations: Abdomen is soft.     Tenderness: There is no abdominal tenderness. There is no right CVA tenderness, left CVA tenderness or guarding.  Musculoskeletal:        General: Normal range of motion.     Cervical back: Normal range of motion and neck supple.  Skin:    General: Skin is warm and dry.  Neurological:     Mental Status: She is alert and oriented to person, place, and time.     Motor: No weakness.     Gait: Gait normal.  Psychiatric:        Mood and Affect: Mood normal.        Thought Content: Thought content normal.        Judgment: Judgment normal.      UC Treatments / Results  Labs (all labs ordered are listed, but only abnormal results are displayed) Labs Reviewed  POCT URINALYSIS DIP (MANUAL ENTRY) - Abnormal; Notable for the following components:      Result Value   Color, UA straw (*)    Bilirubin, UA small (*)    Ketones, POC UA trace (5) (*)    Blood, UA small (*)    Protein Ur, POC =100 (*)    Nitrite, UA Positive (*)    Leukocytes, UA Large (3+) (*)    All other components within normal limits  URINE CULTURE    EKG   Radiology No results found.  Procedures Procedures (including critical care time)  Medications Ordered in UC Medications - No data to display  Initial Impression / Assessment and Plan / UC Course  I have reviewed the triage vital signs and the nursing notes.  Pertinent labs & imaging results that were available during my care of the patient were reviewed by me and considered in my medical decision making  (see chart for details).     Vital signs benign and reassuring today, urinalysis with evidence of a urinary tract infection.  Urine culture pending, treat with Bactrim, increase fluids, empty bladder fully and frequently and follow-up for any worsening symptoms.  Final Clinical Impressions(s) / UC Diagnoses   Final diagnoses:  Acute lower UTI   Discharge Instructions   None    ED Prescriptions     Medication Sig Dispense Auth. Provider   sulfamethoxazole-trimethoprim (BACTRIM DS) 800-160 MG tablet Take 1 tablet by mouth 2 (two) times daily. 6 tablet Volney American, [place]      PDMP not reviewed this encounter.   Volney American, [place] 10/05/21 1559

## 2021-10-07 LAB — URINE CULTURE: Culture: >=100000 — AB

## 2021-10-31 ENCOUNTER — Ambulatory Visit
Admission: EM | Admit: 2021-10-31 | Discharge: 2021-10-31 | Disposition: A | Discharge status: Home / Self Care | Payer: Medicare PPO

## 2021-10-31 ENCOUNTER — Encounter: Payer: Self-pay | Admitting: *Deleted

## 2021-10-31 DIAGNOSIS — J019 Acute sinusitis, unspecified: Secondary | ICD-10-CM | POA: Diagnosis not present

## 2021-10-31 MED ORDER — PROMETHAZINE-DM 6.25-15 MG/5ML PO SYRP: 5.0000 mL | ORAL_SOLUTION | Freq: Four times a day (QID) | ORAL | 0 refills | Status: DC | PRN

## 2021-10-31 MED ORDER — DOXYCYCLINE HYCLATE 100 MG PO TABS: 100.0000 mg | ORAL_TABLET | Freq: Two times a day (BID) | ORAL | 0 refills | Status: AC

## 2021-10-31 NOTE — Discharge Instructions (Addendum)
Take medication as directed. Continue your current allergy medication regimen. Increase fluids and get plenty of rest. May take over-the-counter ibuprofen or Tylenol as needed for pain, fever, or general discomfort. Recommend normal saline nasal spray to help with nasal congestion throughout the day. For your cough, it may be helpful to use a humidifier at bedtime during sleep. If your symptoms do not improve, an antibiotic has been sent to your pharmacy to be picked up on 11/04/2021. Follow-up with your primary care physician if symptoms do not improve.

## 2021-10-31 NOTE — ED Provider Notes (Signed)
[facility]    CSN: [id number] Arrival date & time: 10/31/21  1219      History   Chief Complaint No chief complaint on file.   HPI Rebecca Mathis is a 66 y.o. female.   The history is provided by the patient.   Patient presents with a several day history of nasal congestion, cough, and chest congestion.  Patient states that her nasal congestion is improving, but she continues to have a cough that she is concerned about.  She states that the cough has now become productive.  She states that the cough is also worse at night.  She states that she did not get much sleep last evening due to the persistent cough.  Patient reports she took 3 home COVID test, all of which were negative.  Patient has continued taking Claritin and using normal saline nasal spray at this time.  Patient denies any obvious known sick contacts.  Past Medical History:  Diagnosis Date   Anxiety    Carotid artery occlusion    Diabetes mellitus without complication ([place])    during pregnancy   Dyslipidemia    GERD (gastroesophageal reflux disease)    INDIGESTION W/ SOME FOODS     Heart murmur    Hyperlipidemia    Hypertension     Patient Active Problem List   Diagnosis Date Noted   Special screening for malignant neoplasms, colon    Adenomatous polyp of ascending colon    Diverticul disease small and large intestine, no perforati or abscess    Aftercare following surgery of the circulatory system, NEC 02/19/2012   Occlusion and stenosis of carotid artery without mention of cerebral infarction 12/18/2011    Past Surgical History:  Procedure Laterality Date   caratoid artery  01/14   CAROTID ENDARTERECTOMY Right 02-01-12   [place]   COLONOSCOPY N/A 08/24/2020   Procedure: COLONOSCOPY;  Surgeon: Aviva Signs, MD;  [facility];  Service: Gastroenterology;  Laterality: N/A;   ENDARTERECTOMY  02/01/2012   Procedure: ENDARTERECTOMY CAROTID;  Surgeon: Serafina Mitchell, MD;  Location: [facility] OR;  Service: Vascular;  Laterality: Right;   NM MYOCAR [place]  11/01/2011   The post stress myocardial perfusion images show a normal pattern of perfusion in all regions. The post stress left ventricle is normal in size. The post-stress ejection fraction is 84%. No significant wall motion abnormalities noted.   PATCH ANGIOPLASTY  02/01/2012   Procedure: PATCH ANGIOPLASTY;  Surgeon: Serafina Mitchell, MD;  Location: [place];  Service: Vascular;  Laterality: Right;  Using 1 x 6 cm vascu- guard patch    POLYPECTOMY  08/24/2020   Procedure: POLYPECTOMY;  Surgeon: Aviva Signs, MD;  [facility];  Service: Gastroenterology;;    OB History   No obstetric history on file.      Home Medications    Prior to Admission medications   Medication Sig Start Date End Date Taking? Authorizing Provider  albuterol (VENTOLIN HFA) 108 (90 Base) MCG/ACT inhaler Inhale 2 puffs into the lungs every 6 (six) hours as needed for shortness of breath or wheezing (when around cats). 08/17/20  Yes [provider]  amLODipine (NORVASC) 10 MG tablet Take 10 mg by mouth daily.   Yes [provider]  Cholecalciferol (D3 ADULT PO) Take 2,000 mcg by mouth daily.   Yes [provider]  Coenzyme Q10 (CO Q-10) 400 MG CAPS Take 400 mg by mouth  daily.   Yes [provider]  doxycycline (VIBRA-TABS) 100 MG tablet Take 1 tablet (100 mg total) by mouth 2 (two) times daily for 7 days. 11/04/21 11/11/21 Yes Azai Gaffin-Warren, Alda Lea, NP  glipiZIDE (GLUCOTROL) 10 MG tablet Take 10 mg by mouth 2 (two) times daily. 06/14/20  Yes [provider]  hydrochlorothiazide (HYDRODIURIL) 25 MG tablet Take 25 mg by mouth daily.  08/24/11  Yes [provider]  Magnesium 500 MG TABS Take 500 mg by mouth daily.   Yes [provider]  Menaquinone-7 (VITAMIN K2 PO) Take 1 tablet by mouth daily.   Yes [provider]  metFORMIN (GLUCOPHAGE)  500 MG tablet Take 1,000 mg by mouth 2 (two) times daily. 08/04/15  Yes [provider]  Milk Thistle 1000 MG CAPS Take 1,000 mg by mouth daily.   Yes [provider]  MOUNJARO 7.5 MG/0.5ML Pen SMARTSIG:7.5 Milligram(s) SUB-Q Once a Week 10/10/21  Yes [provider]  NON FORMULARY Pt uses bipap nightly   Yes [provider]  Nutritional Supplements (JUICE PLUS FIBRE PO) Take 3 tablets by mouth 2 (two) times daily.   Yes [provider]  promethazine-dextromethorphan (PROMETHAZINE-DM) 6.25-15 MG/5ML syrup Take 5 mLs by mouth 4 (four) times daily as needed for cough. 10/31/21  Yes Tyus Kallam-Warren, Alda Lea, NP  rosuvastatin (CRESTOR) 5 MG tablet Take 5 mg by mouth daily.   Yes [provider]  Semaglutide 14 MG TABS Take 14 mg by mouth daily. 12/16/18  Yes [provider]  sulfamethoxazole-trimethoprim (BACTRIM DS) 800-160 MG tablet Take 1 tablet by mouth 2 (two) times daily. 10/05/21  Yes Volney American, PA-C  vitamin B-12 (CYANOCOBALAMIN) 1000 MCG tablet Take 1,000 mcg by mouth daily.   Yes [provider]    Family History Family History  Problem Relation Age of Onset   Coronary artery disease Mother    Cancer Mother        ovarian   Diabetes Mother    Heart disease Mother    Heart attack Mother    Coronary artery disease Father    Other Father        varicose veins   Heart disease Sister    Hypertension Sister    Heart disease Brother    Diabetes Brother    Restless legs syndrome Sister     Social History Social History   Tobacco Use   Smoking status: Former    Types: Cigarettes    Quit date: 09/18/2011    Years since quitting: 10.1   Smokeless tobacco: Never  Vaping Use   Vaping Use: Never used  Substance Use Topics   Alcohol use: No    Alcohol/week: 0.0 standard drinks of alcohol   Drug use: No     Allergies   Lipitor [atorvastatin], Penicillins, Lidocaine, Restylane-l [sodium hyaluronate &  lidocaine], and Latex   Review of Systems Review of Systems Per HPI  Physical Exam Triage Vital Signs ED Triage Vitals  Enc Vitals Group     BP 10/31/21 1306 126/78     Pulse Rate 10/31/21 1306 89     Resp 10/31/21 1306 18     Temp 10/31/21 1306 99.1 F (37.3 C)     Temp Source 10/31/21 1306 Oral     SpO2 10/31/21 1306 96 %     Weight --      Height --      Head Circumference --      Peak Flow --  Pain Score 10/31/21 1302 0     Pain Loc --      Pain Edu? --      Excl. in [place]? --    No data found.  Updated Vital Signs BP 126/78 (BP Location: Right Arm)   Pulse 89   Temp 99.1 F (37.3 C) (Oral)   Resp 18   SpO2 96%   Visual Acuity Right Eye Distance:   Left Eye Distance:   Bilateral Distance:    Right Eye Near:   Left Eye Near:    Bilateral Near:     Physical Exam Vitals and nursing note reviewed.  Constitutional:      General: She is not in acute distress.    Appearance: Normal appearance.  HENT:     Head: Normocephalic.     Right Ear: Tympanic membrane, ear canal and external ear normal.     Left Ear: Tympanic membrane, ear canal and external ear normal.     Nose: Congestion present.     Right Turbinates: Enlarged and swollen.     Left Turbinates: Enlarged and swollen.     Right Sinus: No maxillary sinus tenderness or frontal sinus tenderness.     Left Sinus: No maxillary sinus tenderness or frontal sinus tenderness.     Mouth/Throat:     Lips: Pink.     Mouth: Mucous membranes are moist.     Pharynx: Uvula midline. Posterior oropharyngeal erythema present. No pharyngeal swelling.     Tonsils: No tonsillar exudate.  Eyes:     Extraocular Movements: Extraocular movements intact.     Conjunctiva/sclera: Conjunctivae normal.     Pupils: Pupils are equal, round, and reactive to light.  Cardiovascular:     Rate and Rhythm: Normal rate and regular rhythm.     Pulses: Normal pulses.     Heart sounds: Normal heart sounds.  Pulmonary:     Effort:  Pulmonary effort is normal. No respiratory distress.     Breath sounds: Normal breath sounds. No stridor. No wheezing, rhonchi or rales.  Abdominal:     General: Bowel sounds are normal.     Palpations: Abdomen is soft.     Tenderness: There is no abdominal tenderness.  Musculoskeletal:     Cervical back: Normal range of motion.  Lymphadenopathy:     Cervical: No cervical adenopathy.  Skin:    General: Skin is warm and dry.  Neurological:     General: No focal deficit present.     Mental Status: She is alert and oriented to person, place, and time.  Psychiatric:        Mood and Affect: Mood normal.        Behavior: Behavior normal.      UC Treatments / Results  Labs (all labs ordered are listed, but only abnormal results are displayed) Labs Reviewed - No data to display  EKG   Radiology No results found.  Procedures Procedures (including critical care time)  Medications Ordered in UC Medications - No data to display  Initial Impression / Assessment and Plan / UC Course  I have reviewed the triage vital signs and the nursing notes.  Pertinent labs & imaging results that were available during my care of the patient were reviewed by me and considered in my medical decision making (see chart for details).  Patient is well-appearing, she is in no acute distress.  Vital signs are stable at this time.  Symptoms are consistent with a viral sinusitis at this time.  Patient was prescribed Promethazine DM for her cough.  Patient was advised that if her symptoms do not improve over the next 5 days, doxycycline has been sent into her preferred pharmacy to begin on 11/04/2021.  Supportive care recommendations were provided to the patient to include continued use of her current allergy medications, use of a humidifier in her bedroom at nighttime, and over-the-counter analgesics such as Tylenol or ibuprofen as needed for pain.  Patient verbalizes understanding.  All questions were  answered.  Patient is stable for discharge. Final Clinical Impressions(s) / UC Diagnoses   Final diagnoses:  Acute sinusitis, recurrence not specified, unspecified location     Discharge Instructions      Take medication as directed. Continue your current allergy medication regimen. Increase fluids and get plenty of rest. May take over-the-counter ibuprofen or Tylenol as needed for pain, fever, or general discomfort. Recommend normal saline nasal spray to help with nasal congestion throughout the day. For your cough, it may be helpful to use a humidifier at bedtime during sleep. If your symptoms do not improve, an antibiotic has been sent to your pharmacy to be picked up on 11/04/2021. Follow-up with your primary care physician if symptoms do not improve.      ED Prescriptions     Medication Sig Dispense Auth. Provider   doxycycline (VIBRA-TABS) 100 MG tablet Take 1 tablet (100 mg total) by mouth 2 (two) times daily for 7 days. 14 tablet Hanadi Stanly-Warren, Alda Lea, NP   promethazine-dextromethorphan (PROMETHAZINE-DM) 6.25-15 MG/5ML syrup Take 5 mLs by mouth 4 (four) times daily as needed for cough. 140 mL Haruye Lainez-Warren, Alda Lea, NP      PDMP not reviewed this encounter.   Tish Men, NP 10/31/21 1331

## 2021-10-31 NOTE — ED Triage Notes (Signed)
Pt states that she usually takes Claritin but didn't start it as she should. She has some chest congestion she is coughing up green mucous. She did start taking her allergy meds again and its getting better. She is worried it went into a sinus infection. She is using her MDI as needed. She has taken 3 COVID tests and they are all neg.

## 2022-02-21 ENCOUNTER — Ambulatory Visit
Admission: EM | Admit: 2022-02-21 | Discharge: 2022-02-21 | Disposition: A | Discharge status: Home / Self Care | Payer: Medicare PPO | Attending: Nurse Practitioner | Admitting: Nurse Practitioner

## 2022-02-21 DIAGNOSIS — R399 Unspecified symptoms and signs involving the genitourinary system: Secondary | ICD-10-CM

## 2022-02-21 DIAGNOSIS — N898 Other specified noninflammatory disorders of vagina: Secondary | ICD-10-CM

## 2022-02-21 LAB — POCT URINALYSIS DIP (MANUAL ENTRY)
Bilirubin, UA: NEGATIVE
Glucose, UA: NEGATIVE mg/dL
Ketones, POC UA: NEGATIVE mg/dL
Nitrite, UA: NEGATIVE
Protein Ur, POC: 100 mg/dL — AB
Spec Grav, UA: >=1.03 — AB (ref 1.010–1.025)
Urobilinogen, UA: 1 E.U./dL
pH, UA: 5.5 (ref 5.0–8.0)

## 2022-02-21 MED ORDER — SULFAMETHOXAZOLE-TRIMETHOPRIM 800-160 MG PO TABS: 1.0000 | ORAL_TABLET | Freq: Two times a day (BID) | ORAL | 0 refills | Status: AC

## 2022-02-21 NOTE — ED Provider Notes (Signed)
[facility]    CSN: [id number] Arrival date & time: 02/21/22  0810      History   Chief Complaint Chief Complaint  Patient presents with   Dysuria         HPI Rebecca Mathis is a 67 y.o. female.   The history is provided by the patient.   The patient presents with a 3-day history of burning with urination, urinary frequency, and vaginal discharge.  Patient describes the discharge as "white".  Patient states that she also has some pain on her right side.  She denies fever, chills, nausea, vomiting, hematuria, decreased urine stream, low back pain, vaginal odor, or vaginal itching.  Patient states her last urinary tract infection was approximately 4 to 5 months ago.  She reports that she does have a history of diabetes.  Denies history of recurrent urinary tract infections.  Past Medical History:  Diagnosis Date   Anxiety    Carotid artery occlusion    Diabetes mellitus without complication ([place])    during pregnancy   Dyslipidemia    GERD (gastroesophageal reflux disease)    INDIGESTION W/ SOME FOODS     Heart murmur    Hyperlipidemia    Hypertension     Patient Active Problem List   Diagnosis Date Noted   Special screening for malignant neoplasms, colon    Adenomatous polyp of ascending colon    Diverticul disease small and large intestine, no perforati or abscess    Aftercare following surgery of the circulatory system, NEC 02/19/2012   Occlusion and stenosis of carotid artery without mention of cerebral infarction 12/18/2011    Past Surgical History:  Procedure Laterality Date   caratoid artery  01/14   CAROTID ENDARTERECTOMY Right 02-01-12   [place]   COLONOSCOPY N/A 08/24/2020   Procedure: COLONOSCOPY;  Surgeon: Aviva Signs, MD;  [facility];  Service: Gastroenterology;  Laterality: N/A;   ENDARTERECTOMY  02/01/2012   Procedure: ENDARTERECTOMY CAROTID;  Surgeon: Serafina Mitchell, MD;  Location: [facility] OR;  Service:  Vascular;  Laterality: Right;   NM MYOCAR [place]  11/01/2011   The post stress myocardial perfusion images show a normal pattern of perfusion in all regions. The post stress left ventricle is normal in size. The post-stress ejection fraction is 84%. No significant wall motion abnormalities noted.   PATCH ANGIOPLASTY  02/01/2012   Procedure: PATCH ANGIOPLASTY;  Surgeon: Serafina Mitchell, MD;  Location: [place];  Service: Vascular;  Laterality: Right;  Using 1 x 6 cm vascu- guard patch    POLYPECTOMY  08/24/2020   Procedure: POLYPECTOMY;  Surgeon: Aviva Signs, MD;  [facility];  Service: Gastroenterology;;    OB History   No obstetric history on file.      Home Medications    Prior to Admission medications   Medication Sig Start Date End Date Taking? Authorizing Provider  sulfamethoxazole-trimethoprim (BACTRIM DS) 800-160 MG tablet Take 1 tablet by mouth 2 (two) times daily for 7 days. 02/21/22 02/28/22 Yes Camora Tremain-Warren, Alda Lea, NP  albuterol (VENTOLIN HFA) 108 (90 Base) MCG/ACT inhaler Inhale 2 puffs into the lungs every 6 (six) hours as needed for shortness of breath or wheezing (when around cats). 08/17/20   [provider]  amLODipine (NORVASC) 10 MG tablet Take 10 mg by mouth daily.    [provider]  Cholecalciferol (D3 ADULT PO) Take 2,000 mcg by mouth daily.    [provider]  Coenzyme Q10 (CO Q-10) 400 MG CAPS Take 400 mg by mouth daily.    [provider]  glipiZIDE (GLUCOTROL) 10 MG tablet Take 10 mg by mouth 2 (two) times daily. 06/14/20   [provider]  hydrochlorothiazide (HYDRODIURIL) 25 MG tablet Take 25 mg by mouth daily.  08/24/11   [provider]  Magnesium 500 MG TABS Take 500 mg by mouth daily.    [provider]  Menaquinone-7 (VITAMIN K2 PO) Take 1 tablet by mouth daily.    [provider]  metFORMIN (GLUCOPHAGE) 500 MG tablet Take 1,000 mg by mouth 2 (two) times daily.  08/04/15   [provider]  Milk Thistle 1000 MG CAPS Take 1,000 mg by mouth daily.    [provider]  MOUNJARO 7.5 MG/0.5ML Pen SMARTSIG:7.5 Milligram(s) SUB-Q Once a Week 10/10/21   [provider]  NON FORMULARY Pt uses bipap nightly    [provider]  Nutritional Supplements (JUICE PLUS FIBRE PO) Take 3 tablets by mouth 2 (two) times daily.    [provider]  promethazine-dextromethorphan (PROMETHAZINE-DM) 6.25-15 MG/5ML syrup Take 5 mLs by mouth 4 (four) times daily as needed for cough. 10/31/21   Renella Steig-Warren, Alda Lea, NP  rosuvastatin (CRESTOR) 5 MG tablet Take 5 mg by mouth daily.    [provider]  Semaglutide 14 MG TABS Take 14 mg by mouth daily. 12/16/18   [provider]  vitamin B-12 (CYANOCOBALAMIN) 1000 MCG tablet Take 1,000 mcg by mouth daily.    [provider]    Family History Family History  Problem Relation Age of Onset   Coronary artery disease Mother    Cancer Mother        ovarian   Diabetes Mother    Heart disease Mother    Heart attack Mother    Coronary artery disease Father    Other Father        varicose veins   Heart disease Sister    Hypertension Sister    Heart disease Brother    Diabetes Brother    Restless legs syndrome Sister     Social History Social History   Tobacco Use   Smoking status: Former    Types: Cigarettes    Quit date: 09/18/2011    Years since quitting: 10.4   Smokeless tobacco: Never  Vaping Use   Vaping Use: Never used  Substance Use Topics   Alcohol use: No    Alcohol/week: 0.0 standard drinks of alcohol   Drug use: No     Allergies   Lipitor [atorvastatin], Penicillins, Lidocaine, Restylane-l [sodium hyaluronate & lidocaine], and Latex   Review of Systems Review of Systems Per HPI  Physical Exam Triage Vital Signs ED Triage Vitals  Enc Vitals Group     BP 02/21/22 0829 (!) 145/82     Pulse Rate 02/21/22 0829 (!) 101     Resp  02/21/22 0829 16     Temp 02/21/22 0829 98.2 F (36.8 C)     Temp Source 02/21/22 0829 Oral     SpO2 02/21/22 0829 97 %     Weight --      Height --      Head Circumference --      Peak Flow --      Pain Score 02/21/22 0830 0     Pain Loc --      Pain Edu? --      Excl. in [place]? --  No data found.  Updated Vital Signs BP (!) 145/82 (BP Location: Right Arm)   Pulse (!) 101   Temp 98.2 F (36.8 C) (Oral)   Resp 16   SpO2 97%   Visual Acuity Right Eye Distance:   Left Eye Distance:   Bilateral Distance:    Right Eye Near:   Left Eye Near:    Bilateral Near:     Physical Exam Vitals and nursing note reviewed.  Constitutional:      General: She is not in acute distress.    Appearance: Normal appearance.  Eyes:     Extraocular Movements: Extraocular movements intact.     Pupils: Pupils are equal, round, and reactive to light.  Cardiovascular:     Rate and Rhythm: Regular rhythm. Tachycardia present.     Pulses: Normal pulses.     Heart sounds: Normal heart sounds.  Pulmonary:     Effort: Pulmonary effort is normal.     Breath sounds: Normal breath sounds.  Abdominal:     General: Bowel sounds are normal.     Palpations: Abdomen is soft.     Tenderness: There is no abdominal tenderness. There is right CVA tenderness.  Musculoskeletal:     Cervical back: Normal range of motion.  Skin:    General: Skin is warm and dry.  Neurological:     General: No focal deficit present.     Mental Status: She is alert and oriented to person, place, and time.  Psychiatric:        Mood and Affect: Mood normal.        Behavior: Behavior normal.      UC Treatments / Results  Labs (all labs ordered are listed, but only abnormal results are displayed) Labs Reviewed  POCT URINALYSIS DIP (MANUAL ENTRY) - Abnormal; Notable for the following components:      Result Value   Clarity, UA hazy (*)    Spec Grav, UA >=1.030 (*)    Blood, UA large (*)    Protein Ur, POC =100 (*)     Leukocytes, UA Small (1+) (*)    All other components within normal limits  URINE CULTURE  CERVICOVAGINAL ANCILLARY ONLY    EKG   Radiology No results found.  Procedures Procedures (including critical care time)  Medications Ordered in UC Medications - No data to display  Initial Impression / Assessment and Plan / UC Course  I have reviewed the triage vital signs and the nursing notes.  Pertinent labs & imaging results that were available during my care of the patient were reviewed by me and considered in my medical decision making (see chart for details).  The patient is well-appearing, she is in no acute distress, vital signs are stable although she is mildly hypertensive.  Urinalysis suspicious for urinary tract infection, exam exhibit symptoms of possible kidney infection.  Will start patient on Bactrim DS 800/160 at this time.  Supportive care recommendations were provided to the patient to include increasing her water intake, developing a voiding schedule, and over-the-counter analgesics for pain or discomfort.  Strict indication of when follow-up in the emergency department may be indicated.  Urine culture and cytology swab are pending.  Patient is in agreement with this plan of care and verbalizes understanding.  All questions were answered.  Patient stable for discharge.  Final Clinical Impressions(s) / UC Diagnoses   Final diagnoses:  Urinary tract infection symptoms  Vaginal discharge     Discharge Instructions  The urinalysis suggest that you have a urinary tract infection.  The cytology swab and urine culture results are pending.  You will be contacted if the pending test results are positive. Take medication as prescribed. Increase fluids.  Try to drink at least 8-10 8 ounce glasses of water while symptoms persist. As discussed, monitor your blood glucose levels when you begin to have frequent urination as this can also cause you to have symptoms similar to  urinary tract infection. Avoid caffeine, this includes tea, soda, and coffee while symptoms persist. Recommend voiding or peeing every 2 hours while symptoms persist. If you develop fever, chills, with worsening urinary symptoms that are not resolved by the medications prescribed today, please go to the emergency department for further evaluation. Follow-up as needed.     ED Prescriptions     Medication Sig Dispense Auth. Provider   sulfamethoxazole-trimethoprim (BACTRIM DS) 800-160 MG tablet Take 1 tablet by mouth 2 (two) times daily for 7 days. 14 tablet Shoua Ressler-Warren, Alda Lea, NP      PDMP not reviewed this encounter.   Tish Men, NP 02/21/22 1012

## 2022-02-21 NOTE — ED Triage Notes (Signed)
Pt reports, white vaginal discharge,  burning when urinating, increase urinary frequency  x 3 days.

## 2022-02-21 NOTE — Discharge Instructions (Signed)
The urinalysis suggest that you have a urinary tract infection.  The cytology swab and urine culture results are pending.  You will be contacted if the pending test results are positive. Take medication as prescribed. Increase fluids.  Try to drink at least 8-10 8 ounce glasses of water while symptoms persist. As discussed, monitor your blood glucose levels when you begin to have frequent urination as this can also cause you to have symptoms similar to urinary tract infection. Avoid caffeine, this includes tea, soda, and coffee while symptoms persist. Recommend voiding or peeing every 2 hours while symptoms persist. If you develop fever, chills, with worsening urinary symptoms that are not resolved by the medications prescribed today, please go to the emergency department for further evaluation. Follow-up as needed.

## 2022-02-22 LAB — CERVICOVAGINAL ANCILLARY ONLY
Bacterial Vaginitis (gardnerella): POSITIVE — AB
Candida Glabrata: NEGATIVE
Candida Vaginitis: NEGATIVE
Comment: NEGATIVE
Comment: NEGATIVE
Comment: NEGATIVE

## 2022-02-22 LAB — URINE CULTURE: Culture: <10000 — AB

## 2022-02-23 ENCOUNTER — Telehealth (HOSPITAL_COMMUNITY): Payer: Self-pay | Admitting: Emergency Medicine

## 2022-02-23 MED ORDER — METRONIDAZOLE 500 MG PO TABS: 500.0000 mg | ORAL_TABLET | Freq: Two times a day (BID) | ORAL | 0 refills | Status: DC

## 2022-03-02 DIAGNOSIS — E1165 Type 2 diabetes mellitus with hyperglycemia: Secondary | ICD-10-CM | POA: Diagnosis not present

## 2022-03-02 DIAGNOSIS — G43109 Migraine with aura, not intractable, without status migrainosus: Secondary | ICD-10-CM | POA: Diagnosis not present

## 2022-03-02 DIAGNOSIS — Z6829 Body mass index (BMI) 29.0-29.9, adult: Secondary | ICD-10-CM | POA: Diagnosis not present

## 2022-03-02 DIAGNOSIS — E783 Hyperchylomicronemia: Secondary | ICD-10-CM | POA: Diagnosis not present

## 2022-03-02 DIAGNOSIS — E663 Overweight: Secondary | ICD-10-CM | POA: Diagnosis not present

## 2022-03-02 DIAGNOSIS — I10 Essential (primary) hypertension: Secondary | ICD-10-CM | POA: Diagnosis not present

## 2022-03-08 ENCOUNTER — Other Ambulatory Visit: Payer: Self-pay | Admitting: Family Medicine

## 2022-03-08 DIAGNOSIS — Z1231 Encounter for screening mammogram for malignant neoplasm of breast: Secondary | ICD-10-CM

## 2022-03-17 ENCOUNTER — Other Ambulatory Visit: Payer: Self-pay | Admitting: Family Medicine

## 2022-03-17 ENCOUNTER — Ambulatory Visit
Admission: RE | Admit: 2022-03-17 | Discharge: 2022-03-17 | Disposition: A | Discharge status: Home / Self Care | Payer: Medicare PPO | Source: Ambulatory Visit | Attending: Family Medicine | Admitting: Family Medicine

## 2022-03-17 DIAGNOSIS — Z1231 Encounter for screening mammogram for malignant neoplasm of breast: Secondary | ICD-10-CM | POA: Diagnosis not present

## 2022-09-07 ENCOUNTER — Other Ambulatory Visit (HOSPITAL_COMMUNITY): Payer: Self-pay | Admitting: Family Medicine

## 2022-09-07 DIAGNOSIS — R011 Cardiac murmur, unspecified: Secondary | ICD-10-CM | POA: Diagnosis not present

## 2022-09-07 DIAGNOSIS — Z1331 Encounter for screening for depression: Secondary | ICD-10-CM | POA: Diagnosis not present

## 2022-09-07 DIAGNOSIS — Z6829 Body mass index (BMI) 29.0-29.9, adult: Secondary | ICD-10-CM | POA: Diagnosis not present

## 2022-09-07 DIAGNOSIS — I6521 Occlusion and stenosis of right carotid artery: Secondary | ICD-10-CM | POA: Diagnosis not present

## 2022-09-07 DIAGNOSIS — F17201 Nicotine dependence, unspecified, in remission: Secondary | ICD-10-CM

## 2022-09-07 DIAGNOSIS — R0989 Other specified symptoms and signs involving the circulatory and respiratory systems: Secondary | ICD-10-CM | POA: Diagnosis not present

## 2022-09-07 DIAGNOSIS — Z0001 Encounter for general adult medical examination with abnormal findings: Secondary | ICD-10-CM | POA: Diagnosis not present

## 2022-09-07 DIAGNOSIS — E1165 Type 2 diabetes mellitus with hyperglycemia: Secondary | ICD-10-CM | POA: Diagnosis not present

## 2022-09-07 DIAGNOSIS — E783 Hyperchylomicronemia: Secondary | ICD-10-CM | POA: Diagnosis not present

## 2022-10-02 ENCOUNTER — Encounter (INDEPENDENT_AMBULATORY_CARE_PROVIDER_SITE_OTHER): Payer: Self-pay | Admitting: *Deleted

## 2022-10-03 ENCOUNTER — Ambulatory Visit (HOSPITAL_COMMUNITY): Admission: RE | Admit: 2022-10-03 | Payer: Medicare PPO | Source: Ambulatory Visit

## 2022-10-03 ENCOUNTER — Encounter (HOSPITAL_COMMUNITY): Payer: Self-pay

## 2022-10-18 ENCOUNTER — Other Ambulatory Visit: Payer: Self-pay | Admitting: *Deleted

## 2022-10-18 DIAGNOSIS — R0989 Other specified symptoms and signs involving the circulatory and respiratory systems: Secondary | ICD-10-CM

## 2022-10-26 ENCOUNTER — Ambulatory Visit (HOSPITAL_COMMUNITY)
Admission: RE | Admit: 2022-10-26 | Discharge: 2022-10-26 | Disposition: A | Discharge status: Home / Self Care | Payer: Medicare PPO | Source: Ambulatory Visit | Attending: Family Medicine | Admitting: Family Medicine

## 2022-10-26 DIAGNOSIS — Z122 Encounter for screening for malignant neoplasm of respiratory organs: Secondary | ICD-10-CM | POA: Insufficient documentation

## 2022-10-26 DIAGNOSIS — Z87891 Personal history of nicotine dependence: Secondary | ICD-10-CM | POA: Diagnosis not present

## 2022-10-26 DIAGNOSIS — F17210 Nicotine dependence, cigarettes, uncomplicated: Secondary | ICD-10-CM | POA: Diagnosis not present

## 2022-10-26 DIAGNOSIS — F17201 Nicotine dependence, unspecified, in remission: Secondary | ICD-10-CM | POA: Insufficient documentation

## 2022-10-30 ENCOUNTER — Ambulatory Visit (HOSPITAL_COMMUNITY)
Admission: RE | Admit: 2022-10-30 | Discharge: 2022-10-30 | Disposition: A | Discharge status: Home / Self Care | Payer: Medicare PPO | Source: Ambulatory Visit | Attending: Surgery | Admitting: Surgery

## 2022-10-30 ENCOUNTER — Ambulatory Visit: Payer: Medicare PPO | Admitting: Physician Assistant

## 2022-10-30 VITALS — BP 161/80 | HR 110 | Temp 98.2°F | Resp 20 | Wt 167.2 lb

## 2022-10-30 DIAGNOSIS — R0989 Other specified symptoms and signs involving the circulatory and respiratory systems: Secondary | ICD-10-CM | POA: Diagnosis not present

## 2022-10-30 DIAGNOSIS — I6523 Occlusion and stenosis of bilateral carotid arteries: Secondary | ICD-10-CM

## 2022-10-30 NOTE — Progress Notes (Signed)
Office Note     CC:  follow up Requesting Provider:  Assunta Found, MD  HPI: Rebecca Mathis is a 67 y.o. (07/08/1955) female who presents for surveillance follow up of carotid artery stenosis. She has remote history of right CEA in January of 2014 by Dr. Myra Gianotti for asymptomatic stenosis. We have been following her carotids with serial duplex evaluation since with 1-39% bilaterally. She has no history of TIA or stroke  Today she reports overall she is doing great. She did get a speeding ticket on way to her visit so understandably her blood pressure was elevated. She does report one episode of loss of transient loss of vision. 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 She went to RN at the school who thought she might be having an ocular migraine. At the time she did not have any other TIA/ Stroke like symptoms. She followed up with PCP who also suspected ocular migraine as etiology. She has had no recurrence. No slurred speech, no facial drooping, unilateral upper or lower extremity weakness or numbness. She does not have any lower extremity claudication, rest pain or tissue loss.  The pt is on a statin for cholesterol management.  The pt is not on a daily aspirin. Other AC:  none The pt is on CCB, HCTZ for hypertension.   The pt is diabetic.   Tobacco hx:  former  Past Medical History:  Diagnosis Date   Anxiety    Carotid artery occlusion    Diabetes mellitus without complication (HCC)    during pregnancy   Dyslipidemia    GERD (gastroesophageal reflux disease)    INDIGESTION W/ SOME FOODS     Heart murmur    Hyperlipidemia    Hypertension     Past Surgical History:  Procedure Laterality Date   caratoid artery  01/14   CAROTID ENDARTERECTOMY Right 02-01-12   cea   CESAREAN SECTION  1984   COLONOSCOPY N/A 08/24/2020   Procedure:  COLONOSCOPY;  Surgeon: Franky Macho, MD;  [facility];  Service: Gastroenterology;  Laterality: N/A;   ENDARTERECTOMY  02/01/2012   Procedure: ENDARTERECTOMY CAROTID;  Surgeon: Nada Libman, MD;  Location: [facility] OR;  Service: Vascular;  Laterality: Right;   NM MYOCAR PERF EJECTION FRACTION  11/01/2011   The post stress myocardial perfusion images show a normal pattern of perfusion in all regions. The post stress left ventricle is normal in size. The post-stress ejection fraction is 84%. No significant wall motion abnormalities noted.   PATCH ANGIOPLASTY  02/01/2012   Procedure: PATCH ANGIOPLASTY;  Surgeon: Nada Libman, MD;  Location: [facility] OR;  Service: Vascular;  Laterality: Right;  Using 1 x 6 cm vascu- guard patch    POLYPECTOMY  08/24/2020   Procedure: POLYPECTOMY;  Surgeon: Franky Macho, MD;  [facility]: [facility];  Service: Gastroenterology;;    Social History   Socioeconomic History   Marital status: Single    Spouse name: Not on file   Number of children: 1   Years of education: masters   Highest education level: Not on file  Occupational History    Comment: retired  Tobacco Use   Smoking status: Former    Current packs/day: 0.00    Types: Cigarettes    Quit date: 09/18/2011    Years since quitting: 11.1  Smokeless tobacco: Never  Vaping Use   Vaping status: Never Used  Substance and Sexual Activity   Alcohol use: No    Alcohol/week: 0.0 standard drinks of alcohol   Drug use: No   Sexual activity: Not on file  Other Topics Concern   Not on file  Social History Narrative   Patient lives at home alone she is single.   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   Right handed.   Caffeine one cup of coffee daily.   Social Determinants of Health   Financial Resource Strain: Not on file  Food Insecurity: Not on file  Transportation Needs: Not on file  Physical Activity: Not on file  Stress: Not on file  Social  Connections: Not on file  Intimate Partner Violence: Not on file    Family History  Problem Relation Age of Onset   Coronary artery disease Mother    Cancer Mother        ovarian   Diabetes Mother    Heart disease Mother    Heart attack Mother    Coronary artery disease Father    Other Father        varicose veins   Heart disease Sister    Hypertension Sister    Heart disease Brother    Diabetes Brother    Restless legs syndrome Sister     Current Outpatient Medications  Medication Sig Dispense Refill   albuterol (VENTOLIN HFA) 108 (90 Base) MCG/ACT inhaler Inhale 2 puffs into the lungs every 6 (six) hours as needed for shortness of breath or wheezing (when around cats).     amLODipine (NORVASC) 10 MG tablet Take 10 mg by mouth daily.     Cholecalciferol (D3 ADULT PO) Take 2,000 mcg by mouth daily.     Coenzyme Q10 (CO Q-10) 400 MG CAPS Take 400 mg by mouth daily.     glipiZIDE (GLUCOTROL) 10 MG tablet Take 10 mg by mouth 2 (two) times daily.     hydrochlorothiazide (HYDRODIURIL) 25 MG tablet Take 25 mg by mouth daily.      Magnesium 500 MG TABS Take 500 mg by mouth daily.     Menaquinone-7 (VITAMIN K2 PO) Take 1 tablet by mouth daily.     metFORMIN (GLUCOPHAGE) 500 MG tablet Take 1,000 mg by mouth 2 (two) times daily.     metroNIDAZOLE (FLAGYL) 500 MG tablet Take 1 tablet (500 mg total) by mouth 2 (two) times daily. 14 tablet 0   Milk Thistle 1000 MG CAPS Take 1,000 mg by mouth daily.     MOUNJARO 7.5 MG/0.5ML Pen 15 mg once a week.     NON FORMULARY Pt uses bipap nightly     Nutritional Supplements (JUICE PLUS FIBRE PO) Take 3 tablets by mouth 2 (two) times daily.     promethazine-dextromethorphan (PROMETHAZINE-DM) 6.25-15 MG/5ML syrup Take 5 mLs by mouth 4 (four) times daily as needed for cough. 140 mL 0   rosuvastatin (CRESTOR) 5 MG tablet Take 5 mg by mouth daily.     Semaglutide 14 MG TABS Take 14 mg by mouth daily.     vitamin B-12 (CYANOCOBALAMIN) 1000 MCG tablet Take  1,000 mcg by mouth daily.     No current facility-administered medications for this visit.    Allergies  Allergen Reactions   Lipitor [Atorvastatin] Nausea Only   Penicillins Nausea Only   Lidocaine Other (See Comments)    Temporary vision loss  Restylane-L [Sodium Hyaluronate & Lidocaine]     TEMPORARY VISION  LOSS - can tolerate without lidocaine   Latex Rash    If in contact for extended periods of time     REVIEW OF SYSTEMS:  [X]  denotes positive finding, [ ]  denotes negative finding Cardiac  Comments:  Chest pain or chest pressure:    Shortness of breath upon exertion:    Short of breath when lying flat:    Irregular heart rhythm:        Vascular    Pain in calf, thigh, or hip brought on by ambulation:    Pain in feet at night that wakes you up from your sleep:     Blood clot in your veins:    Leg swelling:         Pulmonary    Oxygen at home:    Productive cough:     Wheezing:         Neurologic    Sudden weakness in arms or legs:     Sudden numbness in arms or legs:     Sudden onset of difficulty speaking or slurred speech:    Temporary loss of vision in one eye:     Problems with dizziness:         Gastrointestinal    Blood in stool:     Vomited blood:         Genitourinary    Burning when urinating:     Blood in urine:        Psychiatric    Major depression:         Hematologic    Bleeding problems:    Problems with blood clotting too easily:        Skin    Rashes or ulcers:        Constitutional    Fever or chills:      PHYSICAL EXAMINATION:  Vitals:   10/30/22 1320  BP: (!) 161/80  Pulse: (!) 110  Resp: 20  Temp: 98.2 F (36.8 C)  SpO2: 96%  Weight: 167 lb 3.2 oz (75.8 kg)    General:  WDWN in NAD; vital signs documented above Gait: Normal HENT: WNL, normocephalic Pulmonary: normal non-labored breathing , without wheezing Cardiac: regular HR Abdomen: soft Vascular Exam/Pulses: 2+ femoral, 2 + DP and PT pulses  bilaterally Extremities: without ischemic changes, without Gangrene , without cellulitis; without open wounds;  Musculoskeletal: no muscle wasting or atrophy  Neurologic: A&O X 3 Psychiatric:  The pt has Normal affect.   Non-Invasive Vascular Imaging:   VAS US carotid: Summary:  Right Carotid: Velocities in the right ICA are consistent with a 1-39% stenosis.   Left Carotid: Velocities in the left ICA are consistent with a 1-39% stenosis.   Vertebrals: Bilateral vertebral arteries demonstrate antegrade flow.  Subclavians: Normal flow hemodynamics were seen in bilateral subclavian arteries.    ASSESSMENT/PLAN:: 67 y.o. female here for surveillance follow up of carotid artery stenosis. She has remote history of right CEA in January of 2014 by Dr. Myra Gianotti for asymptomatic stenosis. We have been following her carotids with serial duplex evaluation since with 1-39% bilaterally. She has no history of TIA or stroke. She remains without any associated symptoms. She did have recent ocular migraine. I recommended that if this were to happen again to be seen my her ophthalmologist or call our office. Her duplex today continues to show 1-39% ICA stenosis bilaterally. Normal flow in the vertebral and subclavian arteries bilaterally. -  Continue Statin - Discussed importance of good blood sugar and blood pressure control - Encourage exercise regimen -  reviewed signs and symptoms of TIA/ Stroke and she understands should this occur to seek immediate medical attention - She will follow up again in 2 years with repeat carotid duplex   Graceann Congress, PA-C [facility] [phone]  Clinic MD:  Myra Gianotti

## 2023-03-09 DIAGNOSIS — I6521 Occlusion and stenosis of right carotid artery: Secondary | ICD-10-CM | POA: Diagnosis not present

## 2023-03-09 DIAGNOSIS — E663 Overweight: Secondary | ICD-10-CM | POA: Diagnosis not present

## 2023-03-09 DIAGNOSIS — I10 Essential (primary) hypertension: Secondary | ICD-10-CM | POA: Diagnosis not present

## 2023-03-09 DIAGNOSIS — E782 Mixed hyperlipidemia: Secondary | ICD-10-CM | POA: Diagnosis not present

## 2023-03-09 DIAGNOSIS — I2510 Atherosclerotic heart disease of native coronary artery without angina pectoris: Secondary | ICD-10-CM | POA: Diagnosis not present

## 2023-03-09 DIAGNOSIS — Z6829 Body mass index (BMI) 29.0-29.9, adult: Secondary | ICD-10-CM | POA: Diagnosis not present

## 2023-03-09 DIAGNOSIS — E1165 Type 2 diabetes mellitus with hyperglycemia: Secondary | ICD-10-CM | POA: Diagnosis not present

## 2023-03-09 DIAGNOSIS — E783 Hyperchylomicronemia: Secondary | ICD-10-CM | POA: Diagnosis not present

## 2023-04-30 DIAGNOSIS — R2990 Unspecified symptoms and signs involving the nervous system: Secondary | ICD-10-CM | POA: Diagnosis not present

## 2023-04-30 DIAGNOSIS — I6522 Occlusion and stenosis of left carotid artery: Secondary | ICD-10-CM | POA: Diagnosis not present

## 2023-04-30 DIAGNOSIS — R531 Weakness: Secondary | ICD-10-CM | POA: Diagnosis not present

## 2023-04-30 DIAGNOSIS — R29898 Other symptoms and signs involving the musculoskeletal system: Secondary | ICD-10-CM | POA: Diagnosis not present

## 2023-04-30 DIAGNOSIS — F1590 Other stimulant use, unspecified, uncomplicated: Secondary | ICD-10-CM | POA: Diagnosis not present

## 2023-04-30 DIAGNOSIS — R410 Disorientation, unspecified: Secondary | ICD-10-CM | POA: Diagnosis not present

## 2023-05-09 ENCOUNTER — Ambulatory Visit: Admitting: Nurse Practitioner

## 2023-05-25 DIAGNOSIS — R29810 Facial weakness: Secondary | ICD-10-CM | POA: Diagnosis not present

## 2023-05-25 DIAGNOSIS — Z6830 Body mass index (BMI) 30.0-30.9, adult: Secondary | ICD-10-CM | POA: Diagnosis not present

## 2023-05-25 DIAGNOSIS — R011 Cardiac murmur, unspecified: Secondary | ICD-10-CM | POA: Diagnosis not present

## 2023-05-25 DIAGNOSIS — I2510 Atherosclerotic heart disease of native coronary artery without angina pectoris: Secondary | ICD-10-CM | POA: Diagnosis not present

## 2023-05-25 DIAGNOSIS — E7849 Other hyperlipidemia: Secondary | ICD-10-CM | POA: Diagnosis not present

## 2023-05-25 DIAGNOSIS — E6609 Other obesity due to excess calories: Secondary | ICD-10-CM | POA: Diagnosis not present

## 2023-05-25 DIAGNOSIS — E1165 Type 2 diabetes mellitus with hyperglycemia: Secondary | ICD-10-CM | POA: Diagnosis not present

## 2023-05-25 DIAGNOSIS — I10 Essential (primary) hypertension: Secondary | ICD-10-CM | POA: Diagnosis not present

## 2023-05-29 ENCOUNTER — Ambulatory Visit: Attending: Internal Medicine | Admitting: Internal Medicine

## 2023-05-29 ENCOUNTER — Encounter: Payer: Self-pay | Admitting: Internal Medicine

## 2023-05-29 VITALS — BP 138/82 | HR 97 | Ht 65.0 in | Wt 171.6 lb

## 2023-05-29 DIAGNOSIS — E7849 Other hyperlipidemia: Secondary | ICD-10-CM

## 2023-05-29 DIAGNOSIS — E785 Hyperlipidemia, unspecified: Secondary | ICD-10-CM | POA: Insufficient documentation

## 2023-05-29 DIAGNOSIS — R011 Cardiac murmur, unspecified: Secondary | ICD-10-CM | POA: Diagnosis not present

## 2023-05-29 DIAGNOSIS — I2510 Atherosclerotic heart disease of native coronary artery without angina pectoris: Secondary | ICD-10-CM | POA: Diagnosis not present

## 2023-05-29 DIAGNOSIS — I10 Essential (primary) hypertension: Secondary | ICD-10-CM | POA: Diagnosis not present

## 2023-05-29 DIAGNOSIS — R0989 Other specified symptoms and signs involving the circulatory and respiratory systems: Secondary | ICD-10-CM | POA: Diagnosis not present

## 2023-05-29 HISTORY — DX: Atherosclerotic heart disease of native coronary artery without angina pectoris: I25.10

## 2023-05-29 NOTE — Patient Instructions (Signed)
 Medication Instructions:  Your physician recommends that you continue on your current medications as directed. Please refer to the Current Medication list given to you today.   Labwork: None  Testing/Procedures: Your physician has requested that you have an echocardiogram. Echocardiography is a painless test that uses sound waves to create images of your heart. It provides your doctor with information about the size and shape of your heart and how well your heart's chambers and valves are working. This procedure takes approximately one hour. There are no restrictions for this procedure. Please do NOT wear cologne, perfume, aftershave, or lotions (deodorant is allowed). Please arrive 15 minutes prior to your appointment time.  Please note: We ask at that you not bring children with you during ultrasound (echo/ vascular) testing. Due to room size and safety concerns, children are not allowed in the ultrasound rooms during exams. Our front office staff cannot provide observation of children in our lobby area while testing is being conducted. An adult accompanying a patient to their appointment will only be allowed in the ultrasound room at the discretion of the ultrasound technician under special circumstances. We apologize for any inconvenience.   Follow-Up: Your physician recommends that you schedule a follow-up appointment in: Follow up as needed  Any Other Special Instructions Will Be Listed Below (If Applicable). Thank you for choosing [facility]!     If you need a refill on your cardiac medications before your next appointment, please call your pharmacy.

## 2023-05-29 NOTE — Progress Notes (Signed)
 Cardiology Office Note  Date: 05/29/2023   ID: Rebecca Mathis, DOB 04/04/1955, MRN [id number]  PCP:  Minus Amel, MD  Cardiologist:  Lasalle Pointer, MD Electrophysiologist:  None   History of Present Illness: Rebecca Mathis is a 68 y.o. female  Referred to cardiology clinic for evaluation of age advanced coronary artery calcifications on CT chest from October 2024.  She denies having any angina or DOE.  No dizziness, palpitations, leg swelling.  No orthopnea or PND.  In fact, she walked for 9 miles, 5 miles, 4 miles when she went to [facility] daily.  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  Her mother and her nephew had CAD.  She reported that her carotids were checked and it was less than 40% stenosis on one side and less than 10% stenosis on the other side.  She does not have OSA per patient.  Past Medical History:  Diagnosis Date   Anxiety    Carotid artery occlusion    Diabetes mellitus without complication (HCC)    during pregnancy   Dyslipidemia    GERD (gastroesophageal reflux disease)    INDIGESTION W/ SOME FOODS     Heart murmur    Hyperlipidemia    Hypertension     Past Surgical History:  Procedure Laterality Date   caratoid artery  01/14   CAROTID ENDARTERECTOMY Right 02-01-12   cea   CESAREAN SECTION  1984   COLONOSCOPY N/A 08/24/2020   Procedure: COLONOSCOPY;  Surgeon: Alanda Allegra, MD;  [facility];  Service: Gastroenterology;  Laterality: N/A;   ENDARTERECTOMY  02/01/2012   Procedure: ENDARTERECTOMY CAROTID;  Surgeon: Margherita Shell, MD;  Location: [facility] OR;  Service: Vascular;  Laterality: Right;   NM MYOCAR PERF EJECTION FRACTION  11/01/2011   The post stress myocardial perfusion images show a normal pattern of perfusion in all regions. The post stress left ventricle is normal in size. The post-stress ejection fraction is 84%. No significant wall motion abnormalities noted.   PATCH ANGIOPLASTY  02/01/2012    Procedure: PATCH ANGIOPLASTY;  Surgeon: Margherita Shell, MD;  Location: [facility] OR;  Service: Vascular;  Laterality: Right;  Using 1 x 6 cm vascu- guard patch    POLYPECTOMY  08/24/2020   Procedure: POLYPECTOMY;  Surgeon: Alanda Allegra, MD;  [facility];  Service: Gastroenterology;;    Current Outpatient Medications  Medication Sig Dispense Refill   albuterol (VENTOLIN HFA) 108 (90 Base) MCG/ACT inhaler Inhale 2 puffs into the lungs every 6 (six) hours as needed for shortness of breath or wheezing (when around cats).     amLODipine  (NORVASC ) 10 MG tablet Take 10 mg by mouth daily.     Cholecalciferol (D3 ADULT PO) Take 2,000 mcg by mouth daily.     Coenzyme Q10 (CO Q-10) 400 MG CAPS Take 400 mg by mouth daily.     glipiZIDE (GLUCOTROL) 10 MG tablet Take 10 mg by mouth 2 (two) times daily.     losartan (COZAAR) 50 MG tablet Take 75 mg by mouth daily.     Magnesium  500 MG TABS Take 500 mg by mouth daily.     Menaquinone-7 (VITAMIN K2 PO) Take 1 tablet by mouth daily.     metFORMIN (GLUCOPHAGE) 500 MG tablet Take 1,000 mg by mouth 2 (two) times daily.     Milk Thistle 1000 MG CAPS Take 1,000 mg by mouth daily.     MOUNJARO 7.5 MG/0.5ML  Pen 15 mg once a week.     NON FORMULARY Pt uses bipap nightly     Nutritional Supplements (JUICE PLUS FIBRE PO) Take 3 tablets by mouth 2 (two) times daily.     rosuvastatin  (CRESTOR ) 5 MG tablet Take 5 mg by mouth every other day. Alternating with 10mg  every other day     vitamin B-12 (CYANOCOBALAMIN) 1000 MCG tablet Take 1,000 mcg by mouth daily.     No current facility-administered medications for this visit.   Allergies:  Lipitor [atorvastatin ], Penicillins, Lidocaine , Restylane-l [sodium hyaluronate & lidocaine ], and Latex   Social History: The patient  reports that she quit smoking about 11 years ago. Her smoking use included cigarettes. She has never used smokeless tobacco. She reports that she does not drink alcohol and does not use drugs.    Family History: The patient's family history includes Cancer in her mother; Coronary artery disease in her father and mother; Diabetes in her brother and mother; Heart attack in her mother; Heart disease in her brother, mother, and sister; Hypertension in her sister; Other in her father; Restless legs syndrome in her sister.   ROS:  Please see the history of present illness. Otherwise, complete review of systems is positive for none  All other systems are reviewed and negative.   Physical Exam: VS:  BP 138/82   Pulse 97   Ht 5\' 5"  (1.651 m)   Wt 171 lb 9.6 oz (77.8 kg)   SpO2 98%   BMI 28.56 kg/m , BMI Body mass index is 28.56 kg/m.  Wt Readings from Last 3 Encounters:  05/29/23 171 lb 9.6 oz (77.8 kg)  10/30/22 167 lb 3.2 oz (75.8 kg)  08/24/20 182 lb 1.6 oz (82.6 kg)    General: Patient appears comfortable at rest. HEENT: Conjunctiva and lids normal, oropharynx clear with moist mucosa. Neck: Supple, no elevated JVP or carotid bruits, no thyromegaly. Lungs: Clear to auscultation, nonlabored breathing at rest. Cardiac: Regular rate and rhythm, no S3 or significant systolic murmur, no pericardial rub. Abdomen: Soft, nontender, no hepatomegaly, bowel sounds present, no guarding or rebound. Extremities: No pitting edema, distal pulses 2+. Skin: Warm and dry. Musculoskeletal: No kyphosis. Neuropsychiatric: Alert and oriented x3, affect grossly appropriate.  Recent Labwork: No results found for requested labs within last 365 days.  No results found for: "CHOL", "TRIG", "HDL", "CHOLHDL", "VLDL", "LDLCALC", "LDLDIRECT"    Assessment and Plan:   Coronary calcifications: CT chest imaging from October 2024 showed age advanced coronary artery calcifications. Denies angina or DOE.  In fact, she walked for 9 miles, 5 miles, 4 miles in [place] daily.  Did not have any symptoms of angina or DOE at the time.  Educated symptoms of CAD/MI.  She does not qualify for CT cardiac but if she  wants to pay out-of-pocket, she can reach out to our clinic to let us  know.  Cardiac murmur: Systolic murmur, obtain echocardiogram.  HTN, controlled: Continue current adherences, amlodipine  10 mg once daily, losartan 75 mg once daily.  BP meds were recently increased by PCP.  HLD, unknown values: Continue rosuvastatin  5 mg every other day and 10 mg daily every other day.  Due to age advanced coronary artery calcifications, recommend goal LDL less than 70.  Can be managed by PCP.    25 minutes spent in discussing CT cardiac, CT coronary calcium  score test, calcium  score, plaque, CAD and MI.  Medication Adjustments/Labs and Tests Ordered: Current medicines are reviewed at length with the  patient today.  Concerns regarding medicines are outlined above.    Disposition:  Follow up prn  Signed Esequiel Kleinfelter Beauford Bounds, MD, 05/29/2023 4:21 PM    [facility] at [facility] [address] [place], [place], [place] [address]

## 2023-06-13 ENCOUNTER — Ambulatory Visit: Attending: Internal Medicine

## 2023-06-13 DIAGNOSIS — R011 Cardiac murmur, unspecified: Secondary | ICD-10-CM

## 2023-06-13 LAB — ECHOCARDIOGRAM COMPLETE
AR max vel: 3.47 cm2
AV Area VTI: 2.92 cm2
AV Area mean vel: 3.5 cm2
AV Mean grad: 6 mmHg
AV Peak grad: 9.6 mmHg
Ao pk vel: 1.55 m/s
Area-P 1/2: 3.48 cm2
Calc EF: 71.2 %
MV VTI: 3.41 cm2
S' Lateral: 2 cm
Single Plane A2C EF: 69 %
Single Plane A4C EF: 75.3 %

## 2023-06-15 ENCOUNTER — Ambulatory Visit: Payer: Self-pay | Admitting: Internal Medicine

## 2023-06-15 DIAGNOSIS — I340 Nonrheumatic mitral (valve) insufficiency: Secondary | ICD-10-CM

## 2023-06-18 NOTE — Telephone Encounter (Signed)
  Pt is calling to speak with nurse about her echo result

## 2023-06-19 NOTE — Telephone Encounter (Signed)
-----   Message from Vishnu P Mallipeddi sent at 06/15/2023  2:48 PM EDT ----- Normal LV function, moderate LVH with resting LVOT gradient 40 mmHg with no increase with Valsalva, normal RV function, mild MR and no other significant valvular heart disease, CVP 3 mmHg.  Schedule follow-up in 1 year with repeat echocardiogram (obtain LVOT gradient at rest and Valsalva) before the next clinic visit.  Continue current medications, no changes in the plan.

## 2023-06-19 NOTE — Telephone Encounter (Signed)
 The patient has been notified of the result and verbalized understanding.  All questions (if any) were answered. Camilo Cella, [place] 06/19/2023 8:15 AM

## 2023-06-21 ENCOUNTER — Other Ambulatory Visit (HOSPITAL_BASED_OUTPATIENT_CLINIC_OR_DEPARTMENT_OTHER): Payer: Self-pay | Admitting: Family Medicine

## 2023-06-21 DIAGNOSIS — I10 Essential (primary) hypertension: Secondary | ICD-10-CM | POA: Diagnosis not present

## 2023-06-21 DIAGNOSIS — G720 Drug-induced myopathy: Secondary | ICD-10-CM

## 2023-06-21 DIAGNOSIS — E1165 Type 2 diabetes mellitus with hyperglycemia: Secondary | ICD-10-CM | POA: Diagnosis not present

## 2023-06-21 DIAGNOSIS — E782 Mixed hyperlipidemia: Secondary | ICD-10-CM | POA: Diagnosis not present

## 2023-06-21 DIAGNOSIS — E663 Overweight: Secondary | ICD-10-CM | POA: Diagnosis not present

## 2023-06-21 DIAGNOSIS — E7849 Other hyperlipidemia: Secondary | ICD-10-CM

## 2023-06-21 DIAGNOSIS — Z6829 Body mass index (BMI) 29.0-29.9, adult: Secondary | ICD-10-CM | POA: Diagnosis not present

## 2023-06-21 DIAGNOSIS — I2510 Atherosclerotic heart disease of native coronary artery without angina pectoris: Secondary | ICD-10-CM | POA: Diagnosis not present

## 2023-07-10 DIAGNOSIS — E1165 Type 2 diabetes mellitus with hyperglycemia: Secondary | ICD-10-CM | POA: Diagnosis not present

## 2023-07-10 DIAGNOSIS — E7849 Other hyperlipidemia: Secondary | ICD-10-CM | POA: Diagnosis not present

## 2023-07-16 ENCOUNTER — Ambulatory Visit (HOSPITAL_COMMUNITY)
Admission: RE | Admit: 2023-07-16 | Discharge: 2023-07-16 | Disposition: A | Discharge status: Home / Self Care | Payer: Self-pay | Source: Ambulatory Visit | Attending: Family Medicine | Admitting: Family Medicine

## 2023-07-16 DIAGNOSIS — G720 Drug-induced myopathy: Secondary | ICD-10-CM | POA: Insufficient documentation

## 2023-07-16 DIAGNOSIS — E7849 Other hyperlipidemia: Secondary | ICD-10-CM | POA: Insufficient documentation

## 2023-07-16 DIAGNOSIS — T466X5A Adverse effect of antihyperlipidemic and antiarteriosclerotic drugs, initial encounter: Secondary | ICD-10-CM | POA: Insufficient documentation

## 2023-07-30 ENCOUNTER — Telehealth: Payer: Self-pay | Admitting: Internal Medicine

## 2023-07-30 NOTE — Telephone Encounter (Signed)
 Dr.Mallipeddi ordered, will forward to her to result.Done on 07/16/23

## 2023-07-30 NOTE — Telephone Encounter (Signed)
 Patient walked into the office stating that her PCP contacted her regarding her recent Cardiac Score. Was told to contact office she stated that PCP said that someone needs to address the results [phone].

## 2023-07-31 MED ORDER — ASPIRIN 81 MG PO TBEC: 81.0000 mg | DELAYED_RELEASE_TABLET | Freq: Every day | ORAL | Status: AC

## 2023-07-31 NOTE — Telephone Encounter (Signed)
 The patient has been notified of the result and verbalized understanding.  All questions (if any) were answered. Bernett Dorothyann LABOR, RN 07/31/2023 2:43 PM   Patient states she has always taken ASA but failed to mention it to us .I added to the med list  Front office messaged for f/u,patient prefers [place] office

## 2023-08-07 ENCOUNTER — Ambulatory Visit: Admitting: Nurse Practitioner

## 2023-09-11 NOTE — Progress Notes (Unsigned)
 Cardiology Office Note:  .   Date:  09/12/2023  ID:  Rebecca Mathis, DOB 05-03-1955, MRN [id number] PCP: Marvine Rush, MD  [facility] Providers Cardiologist:  Diannah SHAUNNA Maywood, MD {  History of Present Illness: .   Rebecca Mathis is a 68 y.o. female  with PMHx of HTN, HLD, coronary calcification on CT (10/2022), CAC score of 4599 in 99th percentile (cardiac CT score in 07/2023), bilateral carotid stenosis (s/p right CEA in 01/2017 by Dr. Serene; Carotid US  10/30/2022: mild 1-39% stenosis on L/R) who reports to [facility] office for follow up.   Last seen in heartcare OV 05/29/2023 with Dr. Mallipeddi  for referral of age advanced coronary artery calcification on CT chest from October 2024.  Doing well from cardiac standpoint and denied any cardiac complaints.  Systolic murmur noted on exam and ordered echocardiogram. Continued  on amlodipine  10 mg daily, losartan 75 mg daily, Crestor  alternating doses of 5 mg and 10 mg every other day.  Noted patient would not qualify for CT cardiac but can pay out-of-pocket if she decided to proceed and contact office.   ECHO 06/13/2023 showed EF 65 to 70%, + RWMA (mid inferoseptal segment hypokinetic), moderate asymmetric LVH of basal segment, anterior motion of mitral apparatus, resting LVOT gradients only 14 mmHg with no significant increase with Valsalva, mild MV regurgitation, AV sclerosis without AS.   CT score 07/16/2023 showed CAC score of 4599 in 99th percentile, aortic atherosclerosis, new nodular/geographic rounded opacities throughout the left lower lobe. Per Dr. Mallipeddi, patient is physically active without angina or DOE.  Recommended starting ASA 81 mg daily and continuing statins.  Noted no need for stress test unless patient develops any new chest discomfort or DOE.  Patient notifies and states she has already taken ASA but failed to mention.  Today, denies CP, SOB, dizziness, palpitations, syncope.  Reports chronic unchanged fatigue X 1  year.  She is concerned about elevated CAC score and is interested in pursuing CCTA. Reports compliance with medications.  Endorses low sodium healthy diet. She is able to walk around grocery store without any concerns. Stopped smoking in 2013 after 30 years. Denies alcohol/drug use. Family cardiac history significant for MI including mother (at 77 & 41, CABG), nephew (at 20, CABG), sister (at 36).     ROS: 10 point review of system has been reviewed and considered negative except ones been listed in the HPI.   Studies Reviewed: Rebecca Mathis   ECHO 06/13/2023 IMPRESSIONS   1. Left ventricular ejection fraction, by estimation, is 65 to 70%. The  left ventricle has normal function. The left ventricle demonstrates  regional wall motion abnormalities (see scoring diagram/findings for  description). There is moderate asymmetric  left ventricular hypertrophy of the basal segment. Left ventricular  diastolic parameters are indeterminate. Anterior motion of mitral  apparatus noted to some degree, although resting LVOT gradient only 14  mmHg and no significant increase with Valsalva.  Could consider cardiac MRI for further exclusion of infiltrative or  hypertrophic cardiomyopathy.   2. Right ventricular systolic function is normal. The right ventricular  size is normal. Tricuspid regurgitation signal is inadequate for assessing  PA pressure.   3. The mitral valve is grossly normal. Mild mitral valve regurgitation.   4. The aortic valve is tricuspid. Aortic valve regurgitation is not  visualized. Aortic valve sclerosis is present, with no evidence of aortic  valve stenosis. Aortic valve mean gradient measures 6.0 mmHg.   5. The inferior vena cava  is normal in size with greater than 50%  respiratory variability, suggesting right atrial pressure of 3 mmHg.   Comparison(s): A prior study was performed on 11/01/2011. Prior images  unable to be directly viewed.    CT Cardiac Score 07/16/2023 FINDINGS: Coronary  Calcium  Score: Left main: 28 Left anterior descending artery: 1063 Left circumflex artery: 2231 Right coronary artery: 1276 Total: 4599 Percentile: 99th   Aortic atherosclerosis. Pericardium: Normal. Non-cardiac: See separate report from [facility].   IMPRESSION: 1. Coronary calcium  score of 4599. This was 99th percentile for age-, race-, and sex-matched controls. 2. Aortic atherosclerosis.  Physical Exam:   VS:  BP 116/66 (BP Location: Left Arm, Cuff Size: Large)   Pulse 98   Ht 5' 5 (1.651 m)   Wt 168 lb (76.2 kg)   SpO2 97%   BMI 27.96 kg/m    Wt Readings from Last 3 Encounters:  09/12/23 168 lb (76.2 kg)  05/29/23 171 lb 9.6 oz (77.8 kg)  10/30/22 167 lb 3.2 oz (75.8 kg)    GEN: Well nourished, well developed in no acute distress while sitting in chair.  NECK: No JVD; No carotid bruits CARDIAC: RRR, no murmurs, rubs, gallops RESPIRATORY:  Clear to auscultation without rales, wheezing or rhonchi  ABDOMEN: Soft, non-tender, non-distended EXTREMITIES:  No edema; No deformity   ASSESSMENT AND PLAN: .   Coronary artery calcification [place] coronary artery calcium  score greater than 400 Hyperlipidemia LDL goal <70 Family history of ASCVD Fatigue Previous tobacco user  Coronary calcification on CT (10/2022) CAC score of 4599 in 99th percentile (cardiac CT score in 07/2023). Per Dr. Mallipeddi,  no need for stress test unless patient develops any new chest discomfort or DOE.  LDL 57 and LFT WNL in 07/2023 Reports chronic unchanged fatigue X 1 year. Denies any anginal symptoms.  She is concerned about elevated CAC score and is interested in pursuing CCTA.  Also discussed considering stress test, however patient does not think she can walk on treadmill and is not interested in receiving Lexiscan medication.  Patient wants CCTA submitted and will decide if she will proceeds based on insurance coverage.   Order Coronary CTA given hx as above. Discussed BMP required  1 week prior and taking metoprolol  2 hours prior to procedure.  Hold Metformin and Losartan the morning of CCTA.  Continue on  ASA 81 mg and Crestor  alternating doses of 5 mg and 10 mg every other day.  Primary hypertension BP this OV well controlled today: 116/66 Continue on amlodipine  10 mg daily, losartan 50 mg daily Encourage physical activity for 150 minutes per week and heart healthy low sodium diet. Discussed limiting sodium intake to < 2 grams daily.     Carotid stenosis, bilateral s/p right CEA in 01/2017 by Dr. Serene Carotid US  10/30/2022: mild 1-39% stenosis on L/R No bruits on exam. Denies presyncope/syncope. Continue to monitor.  Followed by Vascular and Vein specialist. Last seen 10/30/2022. Noted plans for repeat carotid US  in 10/2024.  Can repeat Carotid US  if bruit present or develops associated symptoms.    LVH Abnormal Echocardiogram ECHO 06/13/2023 showed EF 65 to 70%, + RWMA (mid inferoseptal segment hypokinetic), moderate asymmetric LVH of basal segment, anterior motion of mitral apparatus, resting LVOT gradients only 14 mmHg with no significant increase with Valsalva, mild MV regurgitation, AV sclerosis without AS.  Recommended considering cardiac MRI for further exclusion of infiltrative or HOCM.  Per Dr. Mallipeddi, plan repeat echo in 06/2024 10 and LVOT gradient at  rest and Valsalva Reviewed echo results and Dr. Mallipeddi recommendation. Patient agrees with follow up echo in 1 year.     Dispo: Follow-up in 6 months with Dr. Mallipeddi    Signed, Lorette CINDERELLA Kapur, PA-C

## 2023-09-12 ENCOUNTER — Ambulatory Visit: Attending: Student | Admitting: Physician Assistant

## 2023-09-12 ENCOUNTER — Encounter: Payer: Self-pay | Admitting: Physician Assistant

## 2023-09-12 VITALS — BP 116/66 | HR 98 | Ht 65.0 in | Wt 168.0 lb

## 2023-09-12 DIAGNOSIS — Z8249 Family history of ischemic heart disease and other diseases of the circulatory system: Secondary | ICD-10-CM | POA: Diagnosis not present

## 2023-09-12 DIAGNOSIS — I6523 Occlusion and stenosis of bilateral carotid arteries: Secondary | ICD-10-CM | POA: Diagnosis not present

## 2023-09-12 DIAGNOSIS — Z9889 Other specified postprocedural states: Secondary | ICD-10-CM

## 2023-09-12 DIAGNOSIS — R931 Abnormal findings on diagnostic imaging of heart and coronary circulation: Secondary | ICD-10-CM | POA: Diagnosis not present

## 2023-09-12 DIAGNOSIS — R5383 Other fatigue: Secondary | ICD-10-CM

## 2023-09-12 DIAGNOSIS — I10 Essential (primary) hypertension: Secondary | ICD-10-CM

## 2023-09-12 DIAGNOSIS — E785 Hyperlipidemia, unspecified: Secondary | ICD-10-CM

## 2023-09-12 DIAGNOSIS — I2510 Atherosclerotic heart disease of native coronary artery without angina pectoris: Secondary | ICD-10-CM | POA: Diagnosis not present

## 2023-09-12 DIAGNOSIS — Z87891 Personal history of nicotine dependence: Secondary | ICD-10-CM | POA: Diagnosis not present

## 2023-09-12 MED ORDER — METOPROLOL TARTRATE 100 MG PO TABS: 100.0000 mg | ORAL_TABLET | ORAL | 0 refills | Status: AC

## 2023-09-12 NOTE — Patient Instructions (Signed)
 Medication Instructions:  Your physician recommends that you continue on your current medications as directed. Please refer to the Current Medication list given to you today.  Take Metoprolol  100 mg two hours prior to cardiac CT Scan   *If you need a refill on your cardiac medications before your next appointment, please call your pharmacy*  Lab Work: Your physician recommends that you return for lab work in: BMET   If you have labs (blood work) drawn today and your tests are completely normal, you will receive your results only by: [employer] (if you have MyChart) OR A paper copy in the mail If you have any lab test that is abnormal or we need to change your treatment, we will call you to review the results.  Testing/Procedures:   Your cardiac CT will be scheduled at one of the below locations:   [facility] [address] [place], [place] [address] [phone] (Severe contrast allergies only)  OR   [facility] [address] [place], [place] [address] [phone]  OR   [facility] [address] [place], [place] [address] [phone]  OR   [facility] [address]  [place], [place] [address] [phone]  OR   [facility] [address] [place], [place] [phone]  If scheduled at [facility], please arrive at the [facility] and Children's Entrance (Entrance C2) of [facility] 30 minutes prior to test start time. You can use the FREE valet parking offered at entrance C (encouraged to control the heart rate for the test)  Proceed to the [facility] Radiology Department (first floor) to check-in and test prep.  All radiology patients and guests should use entrance C2 at [facility], accessed from [facility], even though the hospital's physical address listed is [address]  If scheduled at the Heart and Vascular  Tower at [address], please enter the parking lot using the [address] entrance and use the FREE valet service at the patient drop-off area. Enter the building and check-in with registration on the main floor.  If scheduled at [facility], please arrive to the Heart and Vascular Center 15 mins early for check-in and test prep.  There is spacious parking and easy access to the radiology department from the [facility] entrance. Please enter here and check-in with the desk attendant.   If scheduled at [facility], please arrive 30 minutes early for check-in and test prep.  Please follow these instructions carefully (unless otherwise directed):  An IV will be required for this test and Nitroglycerin will be given.  Hold all erectile dysfunction medications at least 3 days (72 hrs) prior to test. (Ie viagra, cialis, sildenafil, tadalafil, etc)   On the Night Before the Test: Be sure to Drink plenty of water . Do not consume any caffeinated/decaffeinated beverages or chocolate 12 hours prior to your test. Do not take any antihistamines 12 hours prior to your test.  If the patient has contrast allergy: Patient will need a prescription for Prednisone and very clear instructions (as follows): Prednisone 50 mg - take 13 hours prior to test Take another Prednisone 50 mg 7 hours prior to test Take another Prednisone 50 mg 1 hour prior to test Take Benadryl 50 mg 1 hour prior to test Patient must complete all four doses of above prophylactic medications. Patient will need a ride after  test due to Benadryl.  On the Day of the Test: Drink plenty of water  until 1 hour prior to the test. Do not eat any food 1 hour prior to test. You may take your regular medications prior to the test.  Take metoprolol  (Lopressor ) two hours prior to test. If you take Furosemide/Hydrochlorothiazide /Spironolactone/Chlorthalidone, please HOLD on the morning of the  test. Patients who wear a continuous glucose monitor MUST remove the device prior to scanning. FEMALES- please wear underwire-free bra if available, avoid dresses & tight clothing  *For Clinical Staff only. Please instruct patient the following:* Heart Rate Medication Recommendations for Cardiac CT  Resting HR < 50 bpm  No medication  Resting HR 50-60 bpm and BP >110/50 mmHG   Consider Metoprolol  tartrate 25 mg PO 90-120 min prior to scan  Resting HR 60-65 bpm and BP >110/50 mmHG  Metoprolol  tartrate 50 mg PO 90-120 minutes prior to scan   Resting HR > 65 bpm and BP >110/50 mmHG  Metoprolol  tartrate 100 mg PO 90-120 minutes prior to scan  Consider Ivabradine 10-15 mg PO or a calcium  channel blocker for resting HR >60 bpm and contraindication to metoprolol  tartrate  Consider Ivabradine 10-15 mg PO in combination with metoprolol  tartrate for HR >80 bpm        After the Test: Drink plenty of water . After receiving IV contrast, you may experience a mild flushed feeling. This is normal. On occasion, you may experience a mild rash up to 24 hours after the test. This is not dangerous. If this occurs, you can take Benadryl 25 mg, Zyrtec, Claritin, or Allegra and increase your fluid intake. (Patients taking Tikosyn should avoid Benadryl, and may take Zyrtec, Claritin, or Allegra) If you experience trouble breathing, this can be serious. If it is severe call 911 IMMEDIATELY. If it is mild, please call our office.  We will call to schedule your test 2-4 weeks out understanding that some insurance companies will need an authorization prior to the service being performed.   For more information and frequently asked questions, please visit our website : [email or web address]  For non-scheduling related questions, please contact the cardiac imaging nurse navigator should you have any questions/concerns: Cardiac Imaging Nurse Navigators Direct Office Dial: [phone]   For scheduling  needs, including cancellations and rescheduling, please call [place], [phone].   Follow-Up: At [facility], you and your health needs are our priority.  As part of our continuing mission to provide you with exceptional heart care, our providers are all part of one team.  This team includes your primary Cardiologist (physician) and Advanced Practice Providers or APPs (Physician Assistants and Nurse Practitioners) who all work together to provide you with the care you need, when you need it.  Your next appointment:   6 month(s)  Provider:   Vishnu Mallipeddi, MD    We recommend signing up for the patient portal called MyChart.  Sign up information is provided on this After Visit Summary.  MyChart is used to connect with patients for Virtual Visits (Telemedicine).  Patients are able to view lab/test results, encounter notes, upcoming appointments, etc.  Non-urgent messages can be sent to your provider as well.   To learn more about what you can do with MyChart, go to [email or web address].   Other Instructions Thank you for choosing [facility]!

## 2023-11-01 ENCOUNTER — Ambulatory Visit: Payer: Self-pay | Admitting: Physician Assistant

## 2023-11-01 LAB — BASIC METABOLIC PANEL WITH GFR
BUN/Creatinine Ratio: 22 (ref 12–28)
BUN: 13 mg/dL (ref 8–27)
CO2: 22 mmol/L (ref 20–29)
Calcium: 9.7 mg/dL (ref 8.7–10.3)
Chloride: 99 mmol/L (ref 96–106)
Creatinine, Ser: 0.58 mg/dL (ref 0.57–1.00)
Glucose: 215 mg/dL — ABNORMAL HIGH (ref 70–99)
Potassium: 4.5 mmol/L (ref 3.5–5.2)
Sodium: 136 mmol/L (ref 134–144)
eGFR: 98 mL/min/1.73 (ref >59)

## 2023-11-02 ENCOUNTER — Encounter (HOSPITAL_COMMUNITY): Payer: Self-pay

## 2023-11-07 ENCOUNTER — Ambulatory Visit (HOSPITAL_COMMUNITY)
Admission: RE | Admit: 2023-11-07 | Discharge: 2023-11-07 | Disposition: A | Discharge status: Home / Self Care | Source: Ambulatory Visit | Attending: Cardiology | Admitting: Cardiology

## 2023-11-07 DIAGNOSIS — R931 Abnormal findings on diagnostic imaging of heart and coronary circulation: Secondary | ICD-10-CM | POA: Diagnosis present

## 2023-11-07 DIAGNOSIS — Z87891 Personal history of nicotine dependence: Secondary | ICD-10-CM | POA: Diagnosis present

## 2023-11-07 DIAGNOSIS — R5383 Other fatigue: Secondary | ICD-10-CM | POA: Diagnosis present

## 2023-11-07 DIAGNOSIS — R9430 Abnormal result of cardiovascular function study, unspecified: Secondary | ICD-10-CM

## 2023-11-07 DIAGNOSIS — Z8249 Family history of ischemic heart disease and other diseases of the circulatory system: Secondary | ICD-10-CM | POA: Diagnosis present

## 2023-11-07 DIAGNOSIS — I2510 Atherosclerotic heart disease of native coronary artery without angina pectoris: Secondary | ICD-10-CM | POA: Diagnosis present

## 2023-11-07 DIAGNOSIS — Z9889 Other specified postprocedural states: Secondary | ICD-10-CM | POA: Insufficient documentation

## 2023-11-07 DIAGNOSIS — I6523 Occlusion and stenosis of bilateral carotid arteries: Secondary | ICD-10-CM | POA: Insufficient documentation

## 2023-11-07 MED ORDER — DILTIAZEM HCL 25 MG/5ML IV SOLN
10.0000 mg | INTRAVENOUS | Status: DC | PRN
  Administered 2023-11-07: 10 mg via INTRAVENOUS

## 2023-11-07 MED ORDER — METOPROLOL TARTRATE 5 MG/5ML IV SOLN
10.0000 mg | Freq: Once | INTRAVENOUS | Status: AC | PRN
  Administered 2023-11-07: 10 mg via INTRAVENOUS

## 2023-11-07 MED ORDER — IOHEXOL 350 MG/ML SOLN
100.0000 mL | Freq: Once | INTRAVENOUS | Status: AC | PRN
  Administered 2023-11-07: 100 mL via INTRAVENOUS

## 2023-11-07 MED ORDER — NITROGLYCERIN 0.4 MG SL SUBL
0.8000 mg | SUBLINGUAL_TABLET | Freq: Once | SUBLINGUAL | Status: AC
  Administered 2023-11-07: 0.8 mg via SUBLINGUAL

## 2023-11-14 ENCOUNTER — Encounter (HOSPITAL_COMMUNITY): Payer: Self-pay | Admitting: Physician Assistant

## 2023-11-14 ENCOUNTER — Telehealth: Payer: Self-pay | Admitting: Internal Medicine

## 2023-11-14 ENCOUNTER — Other Ambulatory Visit: Payer: Self-pay | Admitting: Physician Assistant

## 2023-11-14 DIAGNOSIS — I2510 Atherosclerotic heart disease of native coronary artery without angina pectoris: Secondary | ICD-10-CM

## 2023-11-14 DIAGNOSIS — R931 Abnormal findings on diagnostic imaging of heart and coronary circulation: Secondary | ICD-10-CM

## 2023-11-14 DIAGNOSIS — E7841 Elevated Lipoprotein(a): Secondary | ICD-10-CM

## 2023-11-14 DIAGNOSIS — E785 Hyperlipidemia, unspecified: Secondary | ICD-10-CM

## 2023-11-14 NOTE — Telephone Encounter (Signed)
 Patient calling is calling about the stress test. Please advise

## 2023-11-14 NOTE — Telephone Encounter (Signed)
 Patient states that she would like to have the exercise myoview. Orders have been placed.

## 2023-11-21 ENCOUNTER — Other Ambulatory Visit: Payer: Self-pay | Admitting: Physician Assistant

## 2023-11-21 ENCOUNTER — Telehealth (HOSPITAL_COMMUNITY): Payer: Self-pay | Admitting: *Deleted

## 2023-11-21 DIAGNOSIS — I2510 Atherosclerotic heart disease of native coronary artery without angina pectoris: Secondary | ICD-10-CM

## 2023-11-21 DIAGNOSIS — E785 Hyperlipidemia, unspecified: Secondary | ICD-10-CM

## 2023-11-21 DIAGNOSIS — R931 Abnormal findings on diagnostic imaging of heart and coronary circulation: Secondary | ICD-10-CM

## 2023-11-21 NOTE — Telephone Encounter (Signed)
 Patient given detailed instructions per Myocardial Perfusion Study Information Sheet for the test on 11/23/2023 at 10:45. Patient notified to arrive 15 minutes early and that it is imperative to arrive on time for appointment to keep from having the test rescheduled.  If you need to cancel or reschedule your appointment, please call the office within 24 hours of your appointment. . Patient verbalized understanding.Rebecca Mathis

## 2023-11-23 ENCOUNTER — Ambulatory Visit (HOSPITAL_COMMUNITY)
Admission: RE | Admit: 2023-11-23 | Discharge: 2023-11-23 | Disposition: A | Discharge status: Home / Self Care | Source: Ambulatory Visit | Attending: Physician Assistant | Admitting: Physician Assistant

## 2023-11-23 DIAGNOSIS — R931 Abnormal findings on diagnostic imaging of heart and coronary circulation: Secondary | ICD-10-CM | POA: Insufficient documentation

## 2023-11-23 DIAGNOSIS — I2510 Atherosclerotic heart disease of native coronary artery without angina pectoris: Secondary | ICD-10-CM | POA: Insufficient documentation

## 2023-11-23 DIAGNOSIS — E785 Hyperlipidemia, unspecified: Secondary | ICD-10-CM | POA: Insufficient documentation

## 2023-11-23 LAB — MYOCARDIAL PERFUSION IMAGING
Angina Index: 0
Duke Treadmill Score: 4
Estimated workload: 4.6
Exercise duration (min): 4 min
Exercise duration (sec): 0 s
LV dias vol: 90 mL (ref 46–106)
LV sys vol: 39 mL (ref 3.8–5.2)
MPHR: 151 {beats}/min
Nuc Stress EF: 57 %
Peak HR: 142 {beats}/min
Percent HR: 94 %
Rest HR: 104 {beats}/min
Rest Nuclear Isotope Dose: 11 mCi
SDS: 6
SRS: 5
SSS: 11
ST Depression (mm): 0 mm
Stress Nuclear Isotope Dose: 31.5 mCi
TID: 1.16

## 2023-11-23 MED ORDER — TECHNETIUM TC 99M TETROFOSMIN IV KIT
31.5000 | PACK | Freq: Once | INTRAVENOUS | Status: AC | PRN
  Administered 2023-11-23: 31.5 via INTRAVENOUS

## 2023-11-23 MED ORDER — TECHNETIUM TC 99M TETROFOSMIN IV KIT
11.0000 | PACK | Freq: Once | INTRAVENOUS | Status: AC | PRN
  Administered 2023-11-23: 11 via INTRAVENOUS

## 2023-11-25 ENCOUNTER — Ambulatory Visit: Payer: Self-pay | Admitting: Physician Assistant

## 2023-12-04 ENCOUNTER — Other Ambulatory Visit: Payer: Self-pay | Admitting: Physician Assistant

## 2023-12-31 ENCOUNTER — Ambulatory Visit

## 2024-04-07 ENCOUNTER — Ambulatory Visit: Admitting: Internal Medicine

## 2024-06-16 ENCOUNTER — Ambulatory Visit
